# Patient Record
Sex: Female | Born: 1975 | Race: White | Hispanic: No | Marital: Married | Smoking: Never smoker
Health system: Southern US, Community
[De-identification: ages and names within clinical notes are randomized; demographics above are authoritative.]

## PROBLEM LIST (undated history)

## (undated) DIAGNOSIS — Z98891 History of uterine scar from previous surgery: Secondary | ICD-10-CM

## (undated) DIAGNOSIS — IMO0002 Reserved for concepts with insufficient information to code with codable children: Secondary | ICD-10-CM

## (undated) DIAGNOSIS — D249 Benign neoplasm of unspecified breast: Secondary | ICD-10-CM

## (undated) DIAGNOSIS — Z87440 Personal history of urinary (tract) infections: Secondary | ICD-10-CM

## (undated) DIAGNOSIS — Z8619 Personal history of other infectious and parasitic diseases: Secondary | ICD-10-CM

## (undated) HISTORY — DX: Personal history of other infectious and parasitic diseases: Z86.19

## (undated) HISTORY — PX: BREAST LUMPECTOMY: SHX2

## (undated) HISTORY — DX: Personal history of urinary (tract) infections: Z87.440

## (undated) HISTORY — PX: WISDOM TOOTH EXTRACTION: SHX21

## (undated) HISTORY — DX: Reserved for concepts with insufficient information to code with codable children: IMO0002

## (undated) HISTORY — PX: BIOPSY BREAST: PRO8

---
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
## 2007-11-28 ENCOUNTER — Ambulatory Visit: Payer: Self-pay | Admitting: Family Medicine

## 2010-10-29 NOTE — L&D Delivery Note (Signed)
Cesarean Section Procedure Note  Indications: failed induction, failure to progress: arrest of dilation and LGA baby  Pre-operative Diagnosis: 40 week 3 day pregnancy, likely LGA infant.  Post-operative Diagnosis: same, LGA infant  Surgeon: BOVARD,Kristee Angus   Anesthesia: Epidural anesthesia  Procedure Details  D/W pt and FOB known likely LGA baby and arrest of dilitation at 9+cm, also R/B/A of LTCS.  The risks, benefits, complications, treatment options, and expected outcomes were discussed with the patient.  The patient concurred with the proposed plan, giving informed consent.  . The patient was taken to Operating Room # 1, identified as Margaret George and the procedure verified as C-Section Delivery. A Time Out was held and the above information confirmed.  After dosing of epidural, the patient was draped and prepped in the usual sterile manner. A Pfannenstiel incision was made and carried down through the subcutaneous tissue to the fascia. Fascial incision was made and extended transversely. The fascia was separated from the underlying rectus tissue superiorly and inferiorly. The peritoneum was identified and entered bluntly. Peritoneal incision was extended longitudinally. The utero-vesical peritoneal reflection was incised transversely and the bladder flap was bluntly freed from the lower uterine segment. A low transverse uterine incision was made. Delivered from cephalic presentation was a 10#4ozFemale with Apgar scores of 8 at one minute and 9 at five minutes.After delivery, baby was handed off to waiting pediatric staff.   After the umbilical cord was clamped and cut cord blood was obtained for evaluation. The placenta was removed intact and appeared normal. The uterine outline, tubes and ovaries appeared normal. The uterine incision was closed with running locked sutures of 0.0 Monocryl .  At uterine incision closure a cervical extention was noted and outlined with clamps, closed with 0.0  Monocryl.   Hemostasis was observed after additional sutures and bovy cautery. Lavage was carried out until clear. The peritoneum was reapproximated with 2-0 Vicryl.  The fascia was then reapproximated with running sutures of 0 Vicryl. The subcuticular adipose tissue was irrigated and made hemostatic with bovy cautery.  Reapproximated with 3-0 plain gut.  The skin was reapproximated with 3.0  Vicryl.  Benzoin and steri-strips were used to aid in the closure.    Instrument, sponge, and needle counts were correct prior the abdominal closure and at the conclusion of the case.  Pt tolerated procedure well.   Findings: Viable female infant ([place]) weighing 10#4oz, nl uterus, tubes and ovaried  Estimated Blood Loss:  1000cc         Total IV Fluids:   Uop: 200cc, red tinged at beginning of procedure, clearing at end         Specimens: Placenta to L&D          Complications:  Cervical extention, o/w procedure tolerated well         Disposition: PACU - hemodynamically stable.         Condition: stable  Attending Attestation: I performed the procedure.

## 2011-03-08 LAB — ANTIBODY SCREEN: Antibody Screen: NEGATIVE

## 2011-03-08 LAB — HIV ANTIBODY (ROUTINE TESTING W REFLEX): HIV: NONREACTIVE

## 2011-03-09 LAB — RUBELLA ANTIBODY, IGM: Rubella: IMMUNE

## 2011-04-06 DIAGNOSIS — O368190 Decreased fetal movements, unspecified trimester, not applicable or unspecified: Secondary | ICD-10-CM

## 2011-05-25 ENCOUNTER — Other Ambulatory Visit: Payer: Self-pay | Admitting: Obstetrics and Gynecology

## 2011-05-25 ENCOUNTER — Ambulatory Visit
Admission: RE | Admit: 2011-05-25 | Discharge: 2011-05-25 | Disposition: A | Discharge status: Home / Self Care | Payer: BC Managed Care – PPO | Source: Ambulatory Visit | Attending: Obstetrics and Gynecology | Admitting: Obstetrics and Gynecology

## 2011-05-25 DIAGNOSIS — N63 Unspecified lump in unspecified breast: Secondary | ICD-10-CM

## 2011-05-31 ENCOUNTER — Ambulatory Visit
Admission: RE | Admit: 2011-05-31 | Discharge: 2011-05-31 | Disposition: A | Discharge status: Home / Self Care | Payer: BC Managed Care – PPO | Source: Ambulatory Visit | Attending: Obstetrics and Gynecology | Admitting: Obstetrics and Gynecology

## 2011-05-31 DIAGNOSIS — N63 Unspecified lump in unspecified breast: Secondary | ICD-10-CM

## 2011-07-06 ENCOUNTER — Encounter (HOSPITAL_COMMUNITY): Payer: Self-pay | Admitting: *Deleted

## 2011-07-06 ENCOUNTER — Inpatient Hospital Stay (HOSPITAL_COMMUNITY): Payer: BC Managed Care – PPO

## 2011-07-06 ENCOUNTER — Inpatient Hospital Stay (HOSPITAL_COMMUNITY)
Admission: AD | Admit: 2011-07-06 | Discharge: 2011-07-06 | Disposition: A | Discharge status: Home / Self Care | Payer: BC Managed Care – PPO | Source: Ambulatory Visit | Attending: Obstetrics and Gynecology | Admitting: Obstetrics and Gynecology

## 2011-07-06 DIAGNOSIS — O4420 Partial placenta previa NOS or without hemorrhage, unspecified trimester: Secondary | ICD-10-CM

## 2011-07-06 DIAGNOSIS — Y92410 Unspecified street and highway as the place of occurrence of the external cause: Secondary | ICD-10-CM | POA: Insufficient documentation

## 2011-07-06 DIAGNOSIS — O4400 Placenta previa specified as without hemorrhage, unspecified trimester: Secondary | ICD-10-CM | POA: Insufficient documentation

## 2011-07-06 DIAGNOSIS — Z0379 Encounter for other suspected maternal and fetal conditions ruled out: Secondary | ICD-10-CM | POA: Insufficient documentation

## 2011-07-06 HISTORY — DX: Benign neoplasm of unspecified breast: D24.9
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
## 2011-07-06 NOTE — Consult Note (Signed)
Consulted with Dr. Ambrose Mantle, reviewed HPI/Exam>order ultrasound with 4 hr monitoring.
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
## 2011-07-06 NOTE — Progress Notes (Signed)
Dr. Ambrose Mantle at bedside; reviewed fetal monitoring strip.
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
## 2011-09-18 ENCOUNTER — Other Ambulatory Visit (HOSPITAL_COMMUNITY): Payer: Self-pay | Admitting: Obstetrics and Gynecology

## 2011-09-18 LAB — STREP B DNA PROBE: GBS: POSITIVE

## 2011-09-19 ENCOUNTER — Ambulatory Visit (HOSPITAL_COMMUNITY)
Admission: RE | Admit: 2011-09-19 | Discharge: 2011-09-19 | Disposition: A | Discharge status: Home / Self Care | Payer: BC Managed Care – PPO | Source: Ambulatory Visit | Attending: Obstetrics and Gynecology | Admitting: Obstetrics and Gynecology

## 2011-09-19 DIAGNOSIS — O4400 Placenta previa specified as without hemorrhage, unspecified trimester: Secondary | ICD-10-CM | POA: Insufficient documentation

## 2011-09-19 DIAGNOSIS — O3660X0 Maternal care for excessive fetal growth, unspecified trimester, not applicable or unspecified: Secondary | ICD-10-CM | POA: Insufficient documentation

## 2011-10-11 ENCOUNTER — Inpatient Hospital Stay (HOSPITAL_COMMUNITY)
Admission: RE | Admit: 2011-10-11 | Payer: BC Managed Care – PPO | Source: Ambulatory Visit | Admitting: Obstetrics and Gynecology

## 2011-10-11 ENCOUNTER — Other Ambulatory Visit: Payer: Self-pay | Admitting: Obstetrics and Gynecology

## 2011-10-11 ENCOUNTER — Encounter: Payer: Self-pay | Admitting: Obstetrics and Gynecology

## 2011-10-11 DIAGNOSIS — IMO0002 Reserved for concepts with insufficient information to code with codable children: Secondary | ICD-10-CM | POA: Insufficient documentation

## 2011-10-11 DIAGNOSIS — Z3490 Encounter for supervision of normal pregnancy, unspecified, unspecified trimester: Secondary | ICD-10-CM | POA: Insufficient documentation

## 2011-10-11 HISTORY — DX: Reserved for concepts with insufficient information to code with codable children: IMO0002

## 2011-10-11 NOTE — H&P (Signed)
Margaret George is an 35 y.o. female. G1P0 at 40+ for iol, given post dates and LGA baby by [place] (AFI = 4, EFW = 10# on [place] 12/13).  +FM, no LOF, no VB, occ ctx; uncomplicated prenatal care except LGA and GBBS +  Pertinent OB Gynecological History:  Sexually transmitted diseases: no past history Previous GYN Procedures: none  Last mammogram: normal  Last pap: normal  OB History: G1, P0   Menstrual History:  No LMP recorded. Patient is pregnant.    Past Medical History  Diagnosis Date  . 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 Breast lumpectomy   . Biopsy breast   . Tooth extraction     No family history on file.  Social History:  reports that she has never smoked. She does not have any smokeless tobacco history on file. She reports that she does not drink alcohol or use illicit drugs.  Allergies:  Allergies  Allergen Reactions  . Codeine Itching     (Not in a hospital admission)  Review of Systems  Constitutional: Negative.   HENT: Negative.   Eyes: Negative.   Respiratory: Negative.   Cardiovascular: Negative.   Gastrointestinal: Negative.   Genitourinary: Negative.   Musculoskeletal: Negative.   Skin: Negative.   Neurological: Negative.   Psychiatric/Behavioral: Negative.     There were no vitals taken for this visit. Physical Exam  Constitutional: She is oriented to person, place, and time. She appears well-developed and well-nourished.  HENT:  Head: Normocephalic and atraumatic.  Neck: Normal range of motion. Neck supple.  Cardiovascular: Normal rate and regular rhythm.   Respiratory: Effort normal and breath sounds normal. No respiratory distress.  GI: Soft. Bowel sounds are normal. There is no tenderness.  Musculoskeletal: Normal range of motion.  Neurological: She is alert and oriented to person, place, and time.  Skin: Skin is warm and dry.  Psychiatric: She has a normal mood and affect.  SVE  1/70/-2  No results found for this or any previous visit (from the past 24 hour(s)).  No results found.  Assessment/Plan: 35yo G1P0 at 40+ for IOL given term status and LGA by [place].  D/w pt possibility of LTCS given LGA and iol.  IOL by cervidil then ptiocin and AROM.  Pt GBBS + - PCN prophylaxis.  Close monitoring.    Margaret George,Margaret George 10/11/2011, 6:58 PM

## 2011-10-12 ENCOUNTER — Inpatient Hospital Stay (HOSPITAL_COMMUNITY)
Admission: AD | Admit: 2011-10-12 | Discharge: 2011-10-16 | DRG: 371 | Disposition: A | Discharge status: Home / Self Care | Payer: BC Managed Care – PPO | Source: Ambulatory Visit | Attending: Obstetrics and Gynecology | Admitting: Obstetrics and Gynecology

## 2011-10-12 ENCOUNTER — Encounter (HOSPITAL_COMMUNITY): Payer: Self-pay | Admitting: Anesthesiology

## 2011-10-12 ENCOUNTER — Encounter (HOSPITAL_COMMUNITY): Payer: Self-pay

## 2011-10-12 DIAGNOSIS — Z22330 Carrier of Group B streptococcus: Secondary | ICD-10-CM

## 2011-10-12 DIAGNOSIS — IMO0002 Reserved for concepts with insufficient information to code with codable children: Secondary | ICD-10-CM

## 2011-10-12 DIAGNOSIS — Z98891 History of uterine scar from previous surgery: Secondary | ICD-10-CM

## 2011-10-12 DIAGNOSIS — O99892 Other specified diseases and conditions complicating childbirth: Secondary | ICD-10-CM | POA: Diagnosis present

## 2011-10-12 DIAGNOSIS — O3660X0 Maternal care for excessive fetal growth, unspecified trimester, not applicable or unspecified: Principal | ICD-10-CM | POA: Diagnosis present

## 2011-10-12 LAB — RPR: RPR Ser Ql: NONREACTIVE

## 2011-10-12 LAB — CBC
Hemoglobin: 12.4 g/dL (ref 12.0–15.0)
MCH: 30.6 pg (ref 26.0–34.0)
MCHC: 33.7 g/dL (ref 30.0–36.0)
Platelets: 119 10*3/uL — ABNORMAL LOW (ref 150–400)

## 2011-10-12 MED ORDER — LIDOCAINE HCL (PF) 1 % IJ SOLN: 30.0000 mL | INTRAMUSCULAR | Status: DC | PRN

## 2011-10-12 MED ORDER — BUTORPHANOL TARTRATE 2 MG/ML IJ SOLN: 2.0000 mg | INTRAMUSCULAR | Status: DC | PRN

## 2011-10-12 MED ORDER — OXYTOCIN 20 UNITS IN LACTATED RINGERS INFUSION - SIMPLE
1.0000 m[IU]/min | INTRAVENOUS | Status: DC
  Administered 2011-10-12: 17 m[IU]/min via INTRAVENOUS
  Administered 2011-10-12: 1 m[IU]/min via INTRAVENOUS
  Administered 2011-10-13: 24 m[IU]/min via INTRAVENOUS
  Administered 2011-10-13: 18 m[IU]/min via INTRAVENOUS
  Administered 2011-10-13: 14 m[IU]/min via INTRAVENOUS

## 2011-10-12 MED ORDER — OXYTOCIN BOLUS FROM INFUSION
500.0000 mL | Freq: Once | INTRAVENOUS | Status: DC
  Filled 2011-10-12: qty 500
  Filled 2011-10-12: qty 1000

## 2011-10-12 MED ORDER — ACETAMINOPHEN 325 MG PO TABS: 650.0000 mg | ORAL_TABLET | ORAL | Status: DC | PRN

## 2011-10-12 MED ORDER — ONDANSETRON HCL 4 MG/2ML IJ SOLN
4.0000 mg | Freq: Four times a day (QID) | INTRAMUSCULAR | Status: DC | PRN
  Administered 2011-10-13: 4 mg via INTRAVENOUS
  Filled 2011-10-12: qty 2

## 2011-10-12 MED ORDER — LACTATED RINGERS IV SOLN
INTRAVENOUS | Status: DC
  Administered 2011-10-12 – 2011-10-13 (×5): via INTRAVENOUS

## 2011-10-12 MED ORDER — CITRIC ACID-SODIUM CITRATE 334-500 MG/5ML PO SOLN
30.0000 mL | ORAL | Status: DC | PRN
  Administered 2011-10-13: 30 mL via ORAL
  Filled 2011-10-12: qty 15

## 2011-10-12 MED ORDER — FLEET ENEMA 7-19 GM/118ML RE ENEM: 1.0000 | ENEMA | RECTAL | Status: DC | PRN

## 2011-10-12 MED ORDER — EPHEDRINE 5 MG/ML INJ: 10.0000 mg | INTRAVENOUS | Status: DC | PRN

## 2011-10-12 MED ORDER — OXYTOCIN 20 UNITS IN LACTATED RINGERS INFUSION - SIMPLE
125.0000 mL/h | Freq: Once | INTRAVENOUS | Status: DC
  Filled 2011-10-12: qty 1000

## 2011-10-12 MED ORDER — TERBUTALINE SULFATE 1 MG/ML IJ SOLN: 0.2500 mg | Freq: Once | INTRAMUSCULAR | Status: AC | PRN

## 2011-10-12 MED ORDER — LACTATED RINGERS IV SOLN: 500.0000 mL | Freq: Once | INTRAVENOUS | Status: DC

## 2011-10-12 MED ORDER — FENTANYL 2.5 MCG/ML BUPIVACAINE 1/10 % EPIDURAL INFUSION (WH - ANES)
14.0000 mL/h | INTRAMUSCULAR | Status: DC
  Administered 2011-10-12 – 2011-10-13 (×6): 14 mL/h via EPIDURAL
  Filled 2011-10-12 (×7): qty 60

## 2011-10-12 MED ORDER — PENICILLIN G POTASSIUM 5000000 UNITS IJ SOLR
2.5000 10*6.[IU] | INTRAVENOUS | Status: DC
  Administered 2011-10-12 – 2011-10-13 (×7): 2.5 10*6.[IU] via INTRAVENOUS
  Filled 2011-10-12 (×11): qty 2.5

## 2011-10-12 MED ORDER — FENTANYL 2.5 MCG/ML BUPIVACAINE 1/10 % EPIDURAL INFUSION (WH - ANES)
INTRAMUSCULAR | Status: DC | PRN
Start: 2011-10-12 — End: 2011-10-13
  Administered 2011-10-12: 14 mL/h via EPIDURAL

## 2011-10-12 MED ORDER — OXYCODONE-ACETAMINOPHEN 5-325 MG PO TABS: 2.0000 | ORAL_TABLET | ORAL | Status: DC | PRN

## 2011-10-12 MED ORDER — PHENYLEPHRINE 40 MCG/ML (10ML) SYRINGE FOR IV PUSH (FOR BLOOD PRESSURE SUPPORT)
80.0000 ug | PREFILLED_SYRINGE | INTRAVENOUS | Status: DC | PRN
  Filled 2011-10-12: qty 5

## 2011-10-12 MED ORDER — DIPHENHYDRAMINE HCL 50 MG/ML IJ SOLN
12.5000 mg | INTRAMUSCULAR | Status: AC | PRN
  Administered 2011-10-12 – 2011-10-13 (×3): 12.5 mg via INTRAVENOUS
  Filled 2011-10-12 (×2): qty 1

## 2011-10-12 MED ORDER — PENICILLIN G POTASSIUM 5000000 UNITS IJ SOLR
5.0000 10*6.[IU] | Freq: Once | INTRAMUSCULAR | Status: AC
  Administered 2011-10-12: 5 10*6.[IU] via INTRAVENOUS
  Filled 2011-10-12: qty 5

## 2011-10-12 MED ORDER — PHENYLEPHRINE 40 MCG/ML (10ML) SYRINGE FOR IV PUSH (FOR BLOOD PRESSURE SUPPORT): 80.0000 ug | PREFILLED_SYRINGE | INTRAVENOUS | Status: DC | PRN

## 2011-10-12 MED ORDER — IBUPROFEN 600 MG PO TABS: 600.0000 mg | ORAL_TABLET | Freq: Four times a day (QID) | ORAL | Status: DC | PRN

## 2011-10-12 MED ORDER — LACTATED RINGERS IV SOLN
500.0000 mL | INTRAVENOUS | Status: DC | PRN
  Administered 2011-10-13: 1000 mL via INTRAVENOUS

## 2011-10-12 MED ORDER — DINOPROSTONE 10 MG VA INST: 10.0000 mg | VAGINAL_INSERT | Freq: Once | VAGINAL | Status: DC

## 2011-10-12 MED ORDER — EPHEDRINE 5 MG/ML INJ
10.0000 mg | INTRAVENOUS | Status: DC | PRN
  Filled 2011-10-12: qty 4

## 2011-10-12 MED ORDER — LIDOCAINE HCL 1.5 % IJ SOLN
INTRAMUSCULAR | Status: DC | PRN
  Administered 2011-10-12 (×2): 4 mL via EPIDURAL

## 2011-10-12 NOTE — Progress Notes (Addendum)
Margaret George is a 35 y.o. G1P0 at [redacted]w[redacted]d  admitted for iol  Subjective: comf with epidural  Objective: BP 113/74  Pulse 79  Temp(Src) 98.2 F (36.8 C) (Oral)  Ht 5\' 6"  (1.676 m)  Wt 95.255 kg (210 lb)  BMI 33.89 kg/m2  SpO2 99%      FHT:  FHR: 150 bpm, variability: moderate,  accelerations:  Present,  decelerations:  Absent UC:   regular, every 2-3 minutes SVE:   Dilation: 3 Effacement (%): 90 Station: 0 Exam by:: bovard  Labs: Lab Results  Component Value Date   WBC 12.1* 10/12/2011   HGB 12.4 10/12/2011   HCT 36.8 10/12/2011   MCV 90.9 10/12/2011   PLT 119* 10/12/2011    Assessment / Plan: Induction of labor due to term and LGA,  progressing well on pitocin  Labor: Progressing normally Preeclampsia:  no signs or symptoms of toxicity Fetal Wellbeing:  Category II Pain Control:  Epidural I/D:  n/a Anticipated MOD:  NSVD  BOVARD,Vidal Lampkins 10/12/2011, 5:46 PM

## 2011-10-12 NOTE — Progress Notes (Signed)
Margaret George is a 35 y.o. G1P0 at [redacted]w[redacted]d admitted for induction of labor due to Elective at term and LGA baby.  Subjective: comf with epidural  Objective: BP 104/68  Pulse 78  Temp(Src) 98.4 F (36.9 C) (Oral)  Resp 20  Ht 5\' 6"  (1.676 m)  Wt 95.255 kg (210 lb)  BMI 33.89 kg/m2  SpO2 99%   Total I/O In: -  Out: 900 [Urine:900]  FHT:  FHR: 150 bpm, variability: moderate,  accelerations:  Present,  decelerations:  Absent, scalp stim with SVE UC:   regular, every 3-5 minutes SVE:   Dilation: 3.4 Effacement (%): 90 Station: 0 Exam by:: bovard IUPC placed w/o diff/comp  Labs: Lab Results  Component Value Date   WBC 12.1* 10/12/2011   HGB 12.4 10/12/2011   HCT 36.8 10/12/2011   MCV 90.9 10/12/2011   PLT 119* 10/12/2011    Assessment / Plan: Induction of labor due to term and LGA baby,  progressing slowly  Labor: Progressing normally Preeclampsia:  no signs or symptoms of toxicity Fetal Wellbeing:  Category II Pain Control:  Epidural I/D:  n/a Anticipated MOD:  NSVD  BOVARD,Sequoya Hogsett 10/12/2011, 9:48 PM
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
## 2011-10-12 NOTE — Anesthesia Procedure Notes (Signed)
Epidural Patient location during procedure: OB Start time: 10/12/2011 2:58 PM  Staffing Anesthesiologist: Jorita Bohanon A. Performed by: anesthesiologist   Preanesthetic Checklist Completed: patient identified, site marked, surgical consent, pre-op evaluation, timeout performed, IV checked, risks and benefits discussed and monitors and equipment checked  Epidural Patient position: sitting Prep: site prepped and draped and DuraPrep Patient monitoring: continuous pulse ox and blood pressure Approach: midline Injection technique: LOR air  Needle:  Needle type: Tuohy  Needle gauge: 17 G Needle length: 9 cm Needle insertion depth: 6 cm Catheter type: closed end flexible Catheter size: 19 Gauge Catheter at skin depth: 11 cm Test dose: negative and 1.5% lidocaine  Assessment Events: blood not aspirated, injection not painful, no injection resistance, negative IV test and no paresthesia  Additional Notes Patient is more comfortable after epidural dosed. Please see RN's note for documentation of vital signs and FHR which are stable.

## 2011-10-12 NOTE — Anesthesia Preprocedure Evaluation (Addendum)
Anesthesia Evaluation  Patient identified by MRN, date of birth, ID band Patient awake    Reviewed: Allergy & Precautions, H&P , Patient's Chart, lab work & pertinent test results  Airway Mallampati: III TM Distance: >3 FB Neck ROM: full    Dental No notable dental hx. (+) Teeth Intact   Pulmonary neg pulmonary ROS,  clear to auscultation  Pulmonary exam normal       Cardiovascular neg cardio ROS regular Normal    Neuro/Psych Negative Neurological ROS  Negative Psych ROS   GI/Hepatic negative GI ROS, Neg liver ROS,   Endo/Other  Negative Endocrine ROS  Renal/GU negative Renal ROS  Genitourinary negative   Musculoskeletal   Abdominal Normal abdominal exam  (+)   Peds  Hematology negative hematology ROS (+)   Anesthesia Other Findings   Reproductive/Obstetrics (+) Pregnancy                           Anesthesia Physical Anesthesia Plan  ASA: II  Anesthesia Plan: Epidural   Post-op Pain Management:    Induction:   Airway Management Planned:   Additional Equipment:   Intra-op Plan:   Post-operative Plan:   Informed Consent: I have reviewed the patients History and Physical, chart, labs and discussed the procedure including the risks, benefits and alternatives for the proposed anesthesia with the patient or authorized representative who has indicated his/her understanding and acceptance.     Plan Discussed with: Anesthesiologist and Surgeon  Anesthesia Plan Comments:         Anesthesia Quick Evaluation  

## 2011-10-12 NOTE — H&P (Signed)
Gayatri Teasdale is a 35 y.o. female presenting for iol given term and LGA baby.  Uncomplicated PNC except LGA baby, 10# on [place]. Maternal Medical History:  Fetal activity: Perceived fetal activity is normal.      OB History    Grav Para Term Preterm Abortions TAB SAB Ect Mult Living   1              Past Medical History  Diagnosis Date  . 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 Normal pregnancy 10/11/2011  . Large for gestational age fetus 10/11/2011   Past Surgical History  Procedure Date  . Breast lumpectomy   . Biopsy breast   . Tooth extraction    Family History: family history is not on file. Social History:  reports that she has never smoked. She does not have any smokeless tobacco history on file. She reports that she does not drink alcohol or use illicit drugs. All Codeine Meds PNV  Review of Systems  Constitutional: Negative.   HENT: Negative.   Eyes: Negative.   Respiratory: Negative.   Cardiovascular: Negative.   Gastrointestinal: Negative.   Genitourinary: Negative.   Musculoskeletal: Negative.   Skin: Negative.   Neurological: Negative.   Psychiatric/Behavioral: Negative.     Dilation: 1 Effacement (%): 50 Station: -2 Exam by:: j.[email or web address],rn Blood pressure 109/71, pulse 81, temperature 98.4 F (36.9 C), temperature source Oral, height 5\' 6"  (1.676 m), weight 95.255 kg (210 lb). Maternal Exam:  Uterine Assessment: Contraction strength is moderate.  Contraction frequency is irregular.   Abdomen: Fundal height is 42.   Estimated fetal weight is 10# by [place].   Fetal presentation: vertex     Physical Exam  Constitutional: She is oriented to person, place, and time. She appears well-developed and well-nourished.  HENT:  Head: Normocephalic and atraumatic.  Neck: Normal range of motion. Neck supple. No thyromegaly present.  Cardiovascular: Normal rate and regular rhythm.   Respiratory: Effort normal and breath sounds normal. No respiratory distress.  GI:  Soft. Bowel sounds are normal. There is no tenderness.  Musculoskeletal: Normal range of motion.  Neurological: She is alert and oriented to person, place, and time.  Psychiatric: She has a normal mood and affect.    Prenatal labs: ABO, Rh: O/Positive/-- (05/10 0000) Antibody: Negative (05/10 0000) Rubella: Immune (05/11 0000) RPR: Nonreactive (05/10 0000)  HBsAg: Negative (05/10 0000)  HIV: Non-reactive (05/10 0000)  GBS: Positive (11/20 0000) Hgb 11.9/Plt 183K/ Hgb electro WNL/CF neg/ First  Tri WNL/AFP WNL/  [place] cwd Nl anat, EDC 10/09/11, ant plac, female  Assessment/Plan: 35yo G1P0 at 15+ for iol given term status and LGA fetus Epidural for pain Pitocin to augment PCN for gbbs prophylaxis   BOVARD,Sailor Haughn 10/12/2011, 9:45 AM

## 2011-10-13 ENCOUNTER — Encounter (HOSPITAL_COMMUNITY): Payer: Self-pay

## 2011-10-13 ENCOUNTER — Encounter (HOSPITAL_COMMUNITY)
Admission: AD | Disposition: A | Discharge status: Home / Self Care | Payer: Self-pay | Source: Ambulatory Visit | Attending: Obstetrics and Gynecology

## 2011-10-13 ENCOUNTER — Inpatient Hospital Stay (HOSPITAL_COMMUNITY): Payer: BC Managed Care – PPO | Admitting: Anesthesiology

## 2011-10-13 ENCOUNTER — Encounter (HOSPITAL_COMMUNITY): Payer: Self-pay | Admitting: Anesthesiology

## 2011-10-13 DIAGNOSIS — Z98891 History of uterine scar from previous surgery: Secondary | ICD-10-CM

## 2011-10-13 LAB — CBC
HCT: 31.6 % — ABNORMAL LOW (ref 36.0–46.0)
Hemoglobin: 10.6 g/dL — ABNORMAL LOW (ref 12.0–15.0)
MCV: 91.6 fL (ref 78.0–100.0)
Platelets: 114 10*3/uL — ABNORMAL LOW (ref 150–400)
RBC: 3.45 MIL/uL — ABNORMAL LOW (ref 3.87–5.11)
WBC: 23.1 10*3/uL — ABNORMAL HIGH (ref 4.0–10.5)

## 2011-10-13 SURGERY — Surgical Case
Anesthesia: Regional | Site: Abdomen | Wound class: Clean Contaminated

## 2011-10-13 MED ORDER — FENTANYL CITRATE 0.05 MG/ML IJ SOLN
INTRAMUSCULAR | Status: AC
  Administered 2011-10-13: 25 ug via INTRAVENOUS
  Filled 2011-10-13: qty 2

## 2011-10-13 MED ORDER — ONDANSETRON HCL 4 MG/2ML IJ SOLN
INTRAMUSCULAR | Status: AC
  Filled 2011-10-13: qty 2

## 2011-10-13 MED ORDER — FENTANYL CITRATE 0.05 MG/ML IJ SOLN
INTRAMUSCULAR | Status: AC
  Filled 2011-10-13: qty 2

## 2011-10-13 MED ORDER — IBUPROFEN 600 MG PO TABS: 600.0000 mg | ORAL_TABLET | Freq: Four times a day (QID) | ORAL | Status: DC | PRN

## 2011-10-13 MED ORDER — NALOXONE HCL 0.4 MG/ML IJ SOLN: 0.4000 mg | INTRAMUSCULAR | Status: DC | PRN

## 2011-10-13 MED ORDER — SCOPOLAMINE 1 MG/3DAYS TD PT72
MEDICATED_PATCH | TRANSDERMAL | Status: AC
  Administered 2011-10-13: 1.5 mg via TRANSDERMAL
  Filled 2011-10-13: qty 1

## 2011-10-13 MED ORDER — LIDOCAINE-EPINEPHRINE (PF) 2 %-1:200000 IJ SOLN
INTRAMUSCULAR | Status: AC
  Filled 2011-10-13: qty 20

## 2011-10-13 MED ORDER — HYDROMORPHONE BOLUS VIA INFUSION
INTRAVENOUS | Status: DC | PRN
  Administered 2011-10-13: 1 mg via INTRAVENOUS

## 2011-10-13 MED ORDER — OXYTOCIN 20 UNITS IN LACTATED RINGERS INFUSION - SIMPLE: 125.0000 mL/h | INTRAVENOUS | Status: AC

## 2011-10-13 MED ORDER — DIPHENHYDRAMINE HCL 50 MG/ML IJ SOLN: 12.5000 mg | INTRAMUSCULAR | Status: DC | PRN

## 2011-10-13 MED ORDER — MORPHINE SULFATE (PF) 0.5 MG/ML IJ SOLN
INTRAMUSCULAR | Status: DC | PRN
  Administered 2011-10-13: 1 mg via INTRAVENOUS

## 2011-10-13 MED ORDER — OXYTOCIN 10 UNIT/ML IJ SOLN
INTRAMUSCULAR | Status: AC
  Filled 2011-10-13: qty 2

## 2011-10-13 MED ORDER — SODIUM CHLORIDE 0.9 % IJ SOLN: 3.0000 mL | INTRAMUSCULAR | Status: DC | PRN

## 2011-10-13 MED ORDER — MEPERIDINE HCL 25 MG/ML IJ SOLN: 6.2500 mg | INTRAMUSCULAR | Status: DC | PRN

## 2011-10-13 MED ORDER — SODIUM BICARBONATE 8.4 % IV SOLN
INTRAVENOUS | Status: DC | PRN
  Administered 2011-10-13: 10 mL via EPIDURAL

## 2011-10-13 MED ORDER — SODIUM CHLORIDE 0.9 % IV SOLN: 1.0000 ug/kg/h | INTRAVENOUS | Status: DC | PRN

## 2011-10-13 MED ORDER — WITCH HAZEL-GLYCERIN EX PADS: 1.0000 "application " | MEDICATED_PAD | CUTANEOUS | Status: DC | PRN

## 2011-10-13 MED ORDER — LACTATED RINGERS IV SOLN
INTRAVENOUS | Status: DC
  Administered 2011-10-13 – 2011-10-14 (×2): via INTRAVENOUS

## 2011-10-13 MED ORDER — MEPERIDINE HCL 25 MG/ML IJ SOLN
INTRAMUSCULAR | Status: AC
  Filled 2011-10-13: qty 1

## 2011-10-13 MED ORDER — CEFAZOLIN SODIUM 1-5 GM-% IV SOLN
INTRAVENOUS | Status: DC | PRN
  Administered 2011-10-13: 2 g via INTRAVENOUS

## 2011-10-13 MED ORDER — OXYTOCIN 10 UNIT/ML IJ SOLN
20.0000 [IU] | INTRAVENOUS | Status: DC | PRN
  Administered 2011-10-13: 20 [IU] via INTRAVENOUS

## 2011-10-13 MED ORDER — OXYTOCIN 10 UNIT/ML IJ SOLN
INTRAMUSCULAR | Status: AC
  Filled 2011-10-13: qty 4

## 2011-10-13 MED ORDER — PHENYLEPHRINE HCL 10 MG/ML IJ SOLN
INTRAMUSCULAR | Status: DC | PRN
  Administered 2011-10-13: 80 ug via INTRAVENOUS
  Administered 2011-10-13 (×3): 120 ug via INTRAVENOUS
  Administered 2011-10-13 (×2): 80 ug via INTRAVENOUS

## 2011-10-13 MED ORDER — OXYCODONE-ACETAMINOPHEN 5-325 MG PO TABS
1.0000 | ORAL_TABLET | ORAL | Status: DC | PRN
  Administered 2011-10-14: 1 via ORAL
  Administered 2011-10-14 (×2): 2 via ORAL
  Administered 2011-10-14 (×2): 1 via ORAL
  Administered 2011-10-15 (×2): 2 via ORAL
  Administered 2011-10-15 – 2011-10-16 (×4): 1 via ORAL
  Filled 2011-10-13 (×5): qty 1
  Filled 2011-10-13: qty 2
  Filled 2011-10-13: qty 1
  Filled 2011-10-13: qty 2
  Filled 2011-10-13: qty 1
  Filled 2011-10-13: qty 2
  Filled 2011-10-13: qty 1

## 2011-10-13 MED ORDER — MENTHOL 3 MG MT LOZG: 1.0000 | LOZENGE | OROMUCOSAL | Status: DC | PRN

## 2011-10-13 MED ORDER — PHENYLEPHRINE 40 MCG/ML (10ML) SYRINGE FOR IV PUSH (FOR BLOOD PRESSURE SUPPORT)
PREFILLED_SYRINGE | INTRAVENOUS | Status: AC
  Filled 2011-10-13: qty 5

## 2011-10-13 MED ORDER — ONDANSETRON HCL 4 MG PO TABS: 4.0000 mg | ORAL_TABLET | ORAL | Status: DC | PRN

## 2011-10-13 MED ORDER — SCOPOLAMINE 1 MG/3DAYS TD PT72
1.0000 | MEDICATED_PATCH | Freq: Once | TRANSDERMAL | Status: DC
  Administered 2011-10-13: 1.5 mg via TRANSDERMAL

## 2011-10-13 MED ORDER — DIPHENHYDRAMINE HCL 25 MG PO CAPS: 25.0000 mg | ORAL_CAPSULE | Freq: Four times a day (QID) | ORAL | Status: DC | PRN

## 2011-10-13 MED ORDER — PRENATAL PLUS 27-1 MG PO TABS: 1.0000 | ORAL_TABLET | Freq: Every day | ORAL | Status: DC

## 2011-10-13 MED ORDER — LANOLIN HYDROUS EX OINT: 1.0000 "application " | TOPICAL_OINTMENT | CUTANEOUS | Status: DC | PRN

## 2011-10-13 MED ORDER — TETANUS-DIPHTH-ACELL PERTUSSIS 5-2.5-18.5 LF-MCG/0.5 IM SUSP: 0.5000 mL | Freq: Once | INTRAMUSCULAR | Status: DC

## 2011-10-13 MED ORDER — PHENYLEPHRINE 40 MCG/ML (10ML) SYRINGE FOR IV PUSH (FOR BLOOD PRESSURE SUPPORT)
PREFILLED_SYRINGE | INTRAVENOUS | Status: AC
  Filled 2011-10-13: qty 10

## 2011-10-13 MED ORDER — DIBUCAINE 1 % RE OINT: 1.0000 "application " | TOPICAL_OINTMENT | RECTAL | Status: DC | PRN

## 2011-10-13 MED ORDER — SIMETHICONE 80 MG PO CHEW
80.0000 mg | CHEWABLE_TABLET | Freq: Three times a day (TID) | ORAL | Status: DC
  Administered 2011-10-14 – 2011-10-16 (×6): 80 mg via ORAL

## 2011-10-13 MED ORDER — MEPERIDINE HCL 25 MG/ML IJ SOLN
INTRAMUSCULAR | Status: DC | PRN
  Administered 2011-10-13 (×2): 25 mg via INTRAVENOUS

## 2011-10-13 MED ORDER — ZOLPIDEM TARTRATE 5 MG PO TABS: 5.0000 mg | ORAL_TABLET | Freq: Every evening | ORAL | Status: DC | PRN

## 2011-10-13 MED ORDER — EPHEDRINE SULFATE 50 MG/ML IJ SOLN
INTRAMUSCULAR | Status: DC | PRN
  Administered 2011-10-13 (×5): 10 mg via INTRAVENOUS

## 2011-10-13 MED ORDER — MORPHINE SULFATE 0.5 MG/ML IJ SOLN
INTRAMUSCULAR | Status: AC
  Filled 2011-10-13: qty 10

## 2011-10-13 MED ORDER — EPHEDRINE 5 MG/ML INJ
INTRAVENOUS | Status: AC
  Filled 2011-10-13: qty 10

## 2011-10-13 MED ORDER — DIPHENHYDRAMINE HCL 25 MG PO CAPS
25.0000 mg | ORAL_CAPSULE | ORAL | Status: DC | PRN
  Administered 2011-10-14: 25 mg via ORAL
  Filled 2011-10-13: qty 1

## 2011-10-13 MED ORDER — SODIUM CHLORIDE 0.9 % IR SOLN
Status: DC | PRN
  Administered 2011-10-13: 1000 mL

## 2011-10-13 MED ORDER — HYDROMORPHONE HCL PF 1 MG/ML IJ SOLN
INTRAMUSCULAR | Status: AC
  Filled 2011-10-13: qty 1

## 2011-10-13 MED ORDER — KETOROLAC TROMETHAMINE 30 MG/ML IJ SOLN: 15.0000 mg | Freq: Once | INTRAMUSCULAR | Status: DC | PRN

## 2011-10-13 MED ORDER — SENNOSIDES-DOCUSATE SODIUM 8.6-50 MG PO TABS
2.0000 | ORAL_TABLET | Freq: Every day | ORAL | Status: DC
  Administered 2011-10-14 – 2011-10-15 (×2): 2 via ORAL

## 2011-10-13 MED ORDER — LACTATED RINGERS IV SOLN
INTRAVENOUS | Status: DC | PRN
  Administered 2011-10-13: 16:00:00 via INTRAVENOUS

## 2011-10-13 MED ORDER — SIMETHICONE 80 MG PO CHEW: 80.0000 mg | CHEWABLE_TABLET | ORAL | Status: DC | PRN

## 2011-10-13 MED ORDER — MORPHINE SULFATE (PF) 0.5 MG/ML IJ SOLN
INTRAMUSCULAR | Status: DC | PRN
  Administered 2011-10-13: 4 mg via EPIDURAL

## 2011-10-13 MED ORDER — IBUPROFEN 800 MG PO TABS
800.0000 mg | ORAL_TABLET | Freq: Three times a day (TID) | ORAL | Status: DC
  Administered 2011-10-14 – 2011-10-16 (×7): 800 mg via ORAL
  Filled 2011-10-13 (×7): qty 1

## 2011-10-13 MED ORDER — SODIUM BICARBONATE 8.4 % IV SOLN
INTRAVENOUS | Status: AC
  Filled 2011-10-13: qty 50

## 2011-10-13 MED ORDER — LACTATED RINGERS IV SOLN
INTRAVENOUS | Status: DC | PRN
  Administered 2011-10-13 (×3): via INTRAVENOUS

## 2011-10-13 MED ORDER — ONDANSETRON HCL 4 MG/2ML IJ SOLN: 4.0000 mg | Freq: Three times a day (TID) | INTRAMUSCULAR | Status: DC | PRN

## 2011-10-13 MED ORDER — PROMETHAZINE HCL 25 MG/ML IJ SOLN: 6.2500 mg | INTRAMUSCULAR | Status: DC | PRN

## 2011-10-13 MED ORDER — ONDANSETRON HCL 4 MG/2ML IJ SOLN: 4.0000 mg | INTRAMUSCULAR | Status: DC | PRN

## 2011-10-13 MED ORDER — CEFAZOLIN SODIUM 1-5 GM-% IV SOLN
INTRAVENOUS | Status: AC
  Filled 2011-10-13: qty 100

## 2011-10-13 MED ORDER — DIPHENHYDRAMINE HCL 50 MG/ML IJ SOLN: 25.0000 mg | INTRAMUSCULAR | Status: DC | PRN

## 2011-10-13 MED ORDER — FENTANYL CITRATE 0.05 MG/ML IJ SOLN
25.0000 ug | INTRAMUSCULAR | Status: DC | PRN
  Administered 2011-10-13: 25 ug via INTRAVENOUS
  Administered 2011-10-13: 50 ug via INTRAVENOUS
  Administered 2011-10-13: 25 ug via INTRAVENOUS

## 2011-10-13 MED ORDER — FENTANYL CITRATE 0.05 MG/ML IJ SOLN
INTRAMUSCULAR | Status: DC | PRN
  Administered 2011-10-13: 100 ug via INTRAVENOUS

## 2011-10-13 MED ORDER — ONDANSETRON HCL 4 MG/2ML IJ SOLN
INTRAMUSCULAR | Status: DC | PRN
  Administered 2011-10-13: 4 mg via INTRAVENOUS

## 2011-10-13 MED ORDER — PRENATAL PLUS 27-1 MG PO TABS
1.0000 | ORAL_TABLET | Freq: Every day | ORAL | Status: DC
  Administered 2011-10-14 – 2011-10-16 (×3): 1 via ORAL
  Filled 2011-10-13 (×3): qty 1

## 2011-10-13 SURGICAL SUPPLY — 36 items
BENZOIN TINCTURE PRP APPL 2/3 (GAUZE/BANDAGES/DRESSINGS) ×2 IMPLANT
CHLORAPREP W/TINT 26ML (MISCELLANEOUS) ×2 IMPLANT
CLOSURE STERI STRIP 1/2 X4 (GAUZE/BANDAGES/DRESSINGS) ×2 IMPLANT
CLOTH BEACON ORANGE TIMEOUT ST (SAFETY) ×2 IMPLANT
CONTAINER PREFILL 10% NBF 15ML (MISCELLANEOUS) IMPLANT
DRESSING TELFA 8X3 (GAUZE/BANDAGES/DRESSINGS) ×2 IMPLANT
DRSG COVADERM 4X10 (GAUZE/BANDAGES/DRESSINGS) ×2 IMPLANT
DRSG PAD ABDOMINAL 8X10 ST (GAUZE/BANDAGES/DRESSINGS) ×2 IMPLANT
ELECT REM PT RETURN 9FT ADLT (ELECTROSURGICAL) ×2
ELECTRODE REM PT RTRN 9FT ADLT (ELECTROSURGICAL) ×1 IMPLANT
EXTRACTOR VACUUM M CUP 4 TUBE (SUCTIONS) IMPLANT
GLOVE BIO SURGEON STRL SZ 6.5 (GLOVE) ×4 IMPLANT
GLOVE BIO SURGEON STRL SZ7 (GLOVE) ×2 IMPLANT
GOWN PREVENTION PLUS LG XLONG (DISPOSABLE) ×4 IMPLANT
KIT ABG SYR 3ML LUER SLIP (SYRINGE) IMPLANT
NEEDLE HYPO 25X5/8 SAFETYGLIDE (NEEDLE) IMPLANT
NS IRRIG 1000ML POUR BTL (IV SOLUTION) ×2 IMPLANT
PACK C SECTION WH (CUSTOM PROCEDURE TRAY) ×2 IMPLANT
RTRCTR C-SECT PINK 25CM LRG (MISCELLANEOUS) ×2 IMPLANT
SLEEVE SCD COMPRESS KNEE MED (MISCELLANEOUS) ×2 IMPLANT
STAPLER VISISTAT 35W (STAPLE) IMPLANT
SUT MNCRL 0 VIOLET CTX 36 (SUTURE) ×4 IMPLANT
SUT MONOCRYL 0 CTX 36 (SUTURE) ×4
SUT PLAIN 1 NONE 54 (SUTURE) IMPLANT
SUT PLAIN 2 0 XLH (SUTURE) ×2 IMPLANT
SUT VIC AB 0 CT1 27 (SUTURE) ×2
SUT VIC AB 0 CT1 27XBRD ANBCTR (SUTURE) ×2 IMPLANT
SUT VIC AB 2-0 CT1 27 (SUTURE) ×1
SUT VIC AB 2-0 CT1 TAPERPNT 27 (SUTURE) ×1 IMPLANT
SUT VIC AB 3-0 PS2 18 (SUTURE) ×1
SUT VIC AB 3-0 PS2 18XBRD (SUTURE) ×1 IMPLANT
SYR BULB IRRIGATION 50ML (SYRINGE) IMPLANT
TAPE CLOTH SURG 4X10 WHT LF (GAUZE/BANDAGES/DRESSINGS) ×2 IMPLANT
TOWEL OR 17X24 6PK STRL BLUE (TOWEL DISPOSABLE) ×4 IMPLANT
TRAY FOLEY CATH 14FR (SET/KITS/TRAYS/PACK) IMPLANT
WATER STERILE IRR 1000ML POUR (IV SOLUTION) ×2 IMPLANT

## 2011-10-13 NOTE — Progress Notes (Signed)
Margaret George is a 35 y.o. G1P0 at [redacted]w[redacted]d admitted for induction of labor due to Elective at term and LGA.  Subjective: comf with epidural  Objective: BP 103/55  Pulse 73  Temp(Src) 98.3 F (36.8 C) (Axillary)  Resp 18  Ht 5\' 6"  (1.676 m)  Wt 95.255 kg (210 lb)  BMI 33.89 kg/m2  SpO2 96% I/O last 3 completed shifts: In: -  Out: 3100 [Urine:3100]    FHT:  FHR: 150 bpm, variability: moderate,  accelerations:  Present,  decelerations:  Absent, scalp stimulation with SVE UC:   regular, every 2-3 minutes SVE:   Dilation: Lip/rim Effacement (%): 90;100 Station: +1 Exam by:: Dr. Ellyn Hack  Labs: Lab Results  Component Value Date   WBC 12.1* 10/12/2011   HGB 12.4 10/12/2011   HCT 36.8 10/12/2011   MCV 90.9 10/12/2011   PLT 119* 10/12/2011    Assessment / Plan: Induction of labor due to term and LGA,  With arrest of dilitation.  D/w pt R/B/A of LTCS including but not limited to bleeding, infection, damage to surrounding organs.  Pt and FOB voice understanding, wish to proceed.   Labor: arrest of dilitation Preeclampsia:  no signs or symptoms of toxicity Fetal Wellbeing:  Category II Pain Control:  Epidural I/D:  n/a Anticipated MOD:  LTCS  BOVARD,Ayo Smoak 10/13/2011, 1:37 PM

## 2011-10-13 NOTE — Anesthesia Postprocedure Evaluation (Signed)
  Anesthesia Post-op Note  Patient: Margaret George  Procedure(s) Performed:  CESAREAN SECTION - Primary cesarean section with delivery of baby girl at 1530. Apgars 8/9.  Patient Location: PACU and Mother/Baby  Anesthesia Type: Epidural  Level of Consciousness: awake, alert  and oriented  Airway and Oxygen Therapy: Patient Spontanous Breathing   Post-op Assessment: Patient's Cardiovascular Status Stable and Respiratory Function Stable  Post-op Vital Signs: stable  Complications: No apparent anesthesia complications

## 2011-10-13 NOTE — OR Nursing (Signed)
Uterus massaged by S. Delonta Yohannes RN Two tubes of cord blood to lab. Foley catheter in place upon arrival to OR. Urine color-bloody.

## 2011-10-13 NOTE — Progress Notes (Signed)
Coralee North, CRNA at pt bedside dosing epidural for primary c-section

## 2011-10-13 NOTE — Progress Notes (Signed)
Kaitrin Seybold is a 35 y.o. G1P0 at [redacted]w[redacted]d admitted for induction of labor due to Elective at term and LGA.  Subjective: Uncomf, hip and back pain.  Objective: BP 115/69  Pulse 75  Temp(Src) 98.3 F (36.8 C) (Axillary)  Resp 16  Ht 5\' 6"  (1.676 m)  Wt 95.255 kg (210 lb)  BMI 33.89 kg/m2  SpO2 99% I/O last 3 completed shifts: In: -  Out: 3100 [Urine:3100]    FHT:  FHR: 160's bpm, variability: moderate,  accelerations:  Present,  decelerations:  Absent UC:   regular, every 2 minutes SVE:   Dilation: Lip/rim Effacement (%): 100;90 Station: +1 Exam by:: Dr. Ellyn Hack  Labs: Lab Results  Component Value Date   WBC 12.1* 10/12/2011   HGB 12.4 10/12/2011   HCT 36.8 10/12/2011   MCV 90.9 10/12/2011   PLT 119* 10/12/2011    Assessment / Plan: Induction of labor due to term and LGA baby,  progressing slowly, if no change in 1 hr have d/w pt poss of LTCS  Labor: Progressing slowly Preeclampsia:  no signs or symptoms of toxicity Fetal Wellbeing:  Category II Pain Control:  Epidural, will give bolus dose I/D:  n/a Anticipated MOD:  NSVD vs LTCS  BOVARD,Layann Bluett 10/13/2011, 11:40 AM

## 2011-10-13 NOTE — Transfer of Care (Signed)
Immediate Anesthesia Transfer of Care Note  Patient: Margaret George  Procedure(s) Performed:  CESAREAN SECTION - Primary cesarean section with delivery of baby girl at 1530. Apgars 8/9.  Patient Location: PACU  Anesthesia Type: Epidural  Level of Consciousness: awake, alert  and oriented  Airway & Oxygen Therapy: Patient Spontanous Breathing  Post-op Assessment: Report given to PACU RN and Post -op Vital signs reviewed and stable  Post vital signs: stable  Complications: No apparent anesthesia complications

## 2011-10-13 NOTE — Addendum Note (Signed)
Addendum  created 10/13/11 1942 by Len Blalock   Modules edited:Notes Section

## 2011-10-13 NOTE — Progress Notes (Signed)
Dr. Ellyn Hack updated on pt status via telephone. To come reassess patient

## 2011-10-13 NOTE — Progress Notes (Signed)
Margaret George is a 35 y.o. G1P0 at [redacted]w[redacted]d  admitted for induction of labor due to term and LGA.  Subjective: comf with epidural  Objective: BP 89/52  Pulse 79  Temp(Src) 98.3 F (36.8 C) (Axillary)  Resp 16  Ht 5\' 6"  (1.676 m)  Wt 95.255 kg (210 lb)  BMI 33.89 kg/m2  SpO2 99% I/O last 3 completed shifts: In: -  Out: 3100 [Urine:3100]    FHT:  FHR: 140's bpm, variability: moderate,  accelerations:  Present,  decelerations:  Absent UC:   regular, every 2-3 minutes SVE:   Dilation: Lip/rim Effacement (%): 100;90 Station: +1 Exam by:: Dr. Ellyn Hack  Labs: Lab Results  Component Value Date   WBC 12.1* 10/12/2011   HGB 12.4 10/12/2011   HCT 36.8 10/12/2011   MCV 90.9 10/12/2011   PLT 119* 10/12/2011    Assessment / Plan: Induction of labor due to term and LGA,  progressing well on pitocin  Labor: Progressing slowly, recheck in 2-3 hrs, hopefully start pushing Preeclampsia:  no signs or symptoms of toxicity Fetal Wellbeing:  Category II Pain Control:  Epidural I/D:  n/a Anticipated MOD:  NSVD  BOVARD,Samira Acero 10/13/2011, 9:35 AM

## 2011-10-13 NOTE — Anesthesia Postprocedure Evaluation (Signed)
Anesthesia Post Note  Patient: Margaret George  Procedure(s) Performed:  CESAREAN SECTION - Primary cesarean section with delivery of baby girl at 1530. Apgars 8/9.  Anesthesia type: Epidural  Patient location: PACU  Post pain: Pain level controlled  Post assessment: Post-op Vital signs reviewed  Last Vitals:  Filed Vitals:   10/13/11 1745  BP: 125/70  Pulse: 126  Temp:   Resp: 20    Post vital signs: Reviewed  Level of consciousness: awake  Complications: No apparent anesthesia complications

## 2011-10-13 NOTE — Progress Notes (Addendum)
IUPC MVU averaging 90-100, however, contraction palpating strong Q 1.5-3. MD aware and states IUPC may not be working properly

## 2011-10-13 NOTE — Progress Notes (Signed)
Pitocin off, and IUPC discontinued per Dr. Ellyn Hack

## 2011-10-13 NOTE — Progress Notes (Signed)
Margaret George is a 35 y.o. G1P0 at [redacted]w[redacted]d admitted for induction of labor due to Elective at term and LGA.  Subjective: Comfortable with epidural  Objective: BP 90/50  Pulse 76  Temp(Src) 98.6 F (37 C) (Oral)  Resp 20  Ht 5\' 6"  (1.676 m)  Wt 95.255 kg (210 lb)  BMI 33.89 kg/m2  SpO2 99%   Total I/O In: -  Out: 1650 [Urine:1650]  FHT:  FHR: 140-150 bpm, variability: moderate,  accelerations:  Present,  decelerations:  Absent, scalp stimulation with SVE UC:   regular, every 2-4 minutes SVE:   Dilation:6 Effacement (%): 90 Station: 0/+1 Exam by:: Dr.Bovard  Labs: Lab Results  Component Value Date   WBC 12.1* 10/12/2011   HGB 12.4 10/12/2011   HCT 36.8 10/12/2011   MCV 90.9 10/12/2011   PLT 119* 10/12/2011    Assessment / Plan: Induction of labor due to term and LGA,  progressing well on pitocin  Labor: Progressing normally, will monitor closely to make sure stays on curve Preeclampsia:  no signs or symptoms of toxicity Fetal Wellbeing:  Category II Pain Control:  Epidural I/D:  n/a Anticipated MOD:  NSVD  BOVARD,Charlestine Rookstool 10/13/2011, 3:47 AM

## 2011-10-14 LAB — CBC
HCT: 26.7 % — ABNORMAL LOW (ref 36.0–46.0)
Hemoglobin: 9.1 g/dL — ABNORMAL LOW (ref 12.0–15.0)
MCHC: 34.1 g/dL (ref 30.0–36.0)
MCV: 91.4 fL (ref 78.0–100.0)

## 2011-10-14 NOTE — Progress Notes (Signed)
Subjective: Postpartum Day 1: Cesarean Delivery Patient reports incisional pain and tolerating PO.    Objective: Vital signs in last 24 hours: Temp:  [98.3 F (36.8 C)-99.4 F (37.4 C)] 98.3 F (36.8 C) (12/16 0423) Pulse Rate:  [73-139] 89  (12/16 0423) Resp:  [16-20] 18  (12/16 0423) BP: (90-125)/(51-81) 92/60 mmHg (12/16 0425) SpO2:  [94 %-100 %] 97 % (12/16 0423)  Physical Exam:  General: alert and no distress Lochia: appropriate Uterine Fundus: firm Incision: healing well, bandage intact DVT Evaluation: No evidence of DVT seen on physical exam.   Basename 10/14/11 0507 10/13/11 1709  HGB 9.1* 10.6*  HCT 26.7* 31.6*    Assessment/Plan: Status post Cesarean section. Doing well postoperatively.  Continue current care.  BOVARD,Anielle Headrick 10/14/2011, 9:54 AM

## 2011-10-15 ENCOUNTER — Encounter (HOSPITAL_COMMUNITY): Payer: Self-pay | Admitting: *Deleted

## 2011-10-15 NOTE — Progress Notes (Signed)
Subjective: Postpartum Day 2: Cesarean Delivery Patient reports incisional pain, tolerating PO, + flatus and no problems voiding.    Objective: Vital signs in last 24 hours: Temp:  [97.7 F (36.5 C)-98.2 F (36.8 C)] 97.7 F (36.5 C) (12/17 0516) Pulse Rate:  [79-96] 85  (12/17 0516) Resp:  [18] 18  (12/17 0516) BP: (85-101)/(53-67) 95/62 mmHg (12/17 0516) SpO2:  [96 %-97 %] 96 % (12/16 1600)  Physical Exam:  General: alert and no distress Lochia: appropriate Uterine Fundus: firm Incision: healing well DVT Evaluation: No evidence of DVT seen on physical exam.   Basename 10/14/11 0507 10/13/11 1709  HGB 9.1* 10.6*  HCT 26.7* 31.6*    Assessment/Plan: Status post Cesarean section. Doing well postoperatively.  Continue current care.  BOVARD,Ashia Dehner 10/15/2011, 8:43 AM

## 2011-10-16 ENCOUNTER — Encounter (HOSPITAL_COMMUNITY): Payer: Self-pay | Admitting: Obstetrics and Gynecology

## 2011-10-16 MED ORDER — IBUPROFEN 800 MG PO TABS: 800.0000 mg | ORAL_TABLET | Freq: Three times a day (TID) | ORAL | Status: AC | PRN

## 2011-10-16 MED ORDER — LACTATED RINGERS IV SOLN: INTRAVENOUS | Status: DC

## 2011-10-16 MED ORDER — CEFAZOLIN SODIUM-DEXTROSE 2-3 GM-% IV SOLR
2.0000 g | INTRAVENOUS | Status: DC
  Filled 2011-10-16: qty 50

## 2011-10-16 MED ORDER — PRENATAL PLUS 27-1 MG PO TABS: 1.0000 | ORAL_TABLET | Freq: Every day | ORAL | Status: DC

## 2011-10-16 MED ORDER — OXYCODONE-ACETAMINOPHEN 5-325 MG PO TABS: 1.0000 | ORAL_TABLET | ORAL | Status: AC | PRN

## 2011-10-16 NOTE — Progress Notes (Signed)
Subjective: Postpartum Day 3: Cesarean Delivery Patient reports incisional pain, tolerating PO, + flatus and no problems voiding.    Objective: Vital signs in last 24 hours: Temp:  [97.4 F (36.3 C)-98.3 F (36.8 C)] 97.4 F (36.3 C) (12/18 0605) Pulse Rate:  [80-97] 80  (12/18 0605) Resp:  [18-19] 19  (12/18 0605) BP: (101-108)/(64-72) 101/64 mmHg (12/18 7829)  Physical Exam:  General: alert and no distress Lochia: appropriate Uterine Fundus: firm Incision: healing well DVT Evaluation: No evidence of DVT seen on physical exam.   Basename 10/14/11 0507 10/13/11 1709  HGB 9.1* 10.6*  HCT 26.7* 31.6*    Assessment/Plan: Status post Cesarean section. Doing well postoperatively.  Discharge home with standard precautions and return to clinic in 2 weeks for incisional check.  D/C with Motrin/Vicodin/PNC  George,Margaret Berringer 10/16/2011, 8:50 AM

## 2011-10-16 NOTE — Discharge Summary (Signed)
Obstetric Discharge Summary Reason for Admission: induction of labor Prenatal Procedures: none Intrapartum Procedures: cesarean: low cervical, transverse Postpartum Procedures: none Complications-Operative and Postpartum: none Hemoglobin  Date Value Range Status  10/14/2011 9.1* 12.0-15.0 (g/dL) Final     HCT  Date Value Range Status  10/14/2011 26.7* 36.0-46.0 (%) Final    Discharge Diagnoses: Term Pregnancy-delivered and Failed induction  Discharge Information: Date: 10/16/2011 Activity: pelvic rest Diet: routine Medications: PNV, Ibuprofen and Percocet Condition: stable Instructions: refer to practice specific booklet Discharge to: home Follow-up Information    Follow up with BOVARD,Jane Birkel, MD. Make an appointment in 2 weeks.   Contact information:   [address] [facility] [address] [place] [address] [phone]          Newborn Data: Live born female  Birth Weight: 10 lb 4.9 oz (4675 g) APGAR: 8, 9  Home with mother.  BOVARD,Milda Lindvall 10/16/2011, 8:55 AM

## 2013-06-02 ENCOUNTER — Inpatient Hospital Stay (HOSPITAL_COMMUNITY): Admission: AD | Admit: 2013-06-02 | Payer: Self-pay | Source: Ambulatory Visit | Admitting: Obstetrics and Gynecology

## 2013-06-02 LAB — OB RESULTS CONSOLE RUBELLA ANTIBODY, IGM: RUBELLA: IMMUNE

## 2013-06-02 LAB — OB RESULTS CONSOLE RPR: RPR: NONREACTIVE

## 2013-06-02 LAB — OB RESULTS CONSOLE ABO/RH: RH TYPE: POSITIVE

## 2013-06-02 LAB — OB RESULTS CONSOLE HIV ANTIBODY (ROUTINE TESTING): HIV: NONREACTIVE

## 2013-12-16 ENCOUNTER — Encounter (HOSPITAL_COMMUNITY): Payer: Self-pay | Admitting: Pharmacist

## 2013-12-29 ENCOUNTER — Encounter (HOSPITAL_COMMUNITY): Payer: Self-pay

## 2013-12-29 NOTE — H&P (Signed)
Margaret George is a 38 y.o. female  G2P1001 at 29wk. Relatively uncomplicated Prenatal care.  +FM, no LOF, no VB, occ ctx; Previous pregnancy macrosomic - 10#4. Pregnancy complicated by seasonal allergies.  D/W pt r/b/a of LTCS. Maternal Medical History:  Contractions: Frequency: irregular.    Fetal activity: Perceived fetal activity is normal.    Prenatal Complications - Diabetes: none.    OB History   Grav Para Term Preterm Abortions TAB SAB Ect Mult Living   2 1 1       1     G1 LTCS female 10#4 G2  Present No abn pap, No STDSs Past Medical History  Diagnosis Date  . 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 Normal pregnancy 10/11/2011  . Large for gestational age fetus 10/11/2011  . Large for gestational age (LGA) 10/12/2011   Past Surgical History  Procedure Laterality Date  . Breast lumpectomy    . Biopsy breast    . Tooth extraction    . Cesarean section  10/13/2011    Procedure: CESAREAN SECTION;  Surgeon: Thornell Sartorius, MD;  Location: [facility];  Service: Gynecology;  Laterality: N/A;  Primary cesarean section with delivery of baby girl at 68. Apgars 8/9.   Family History: DM, HTN, Diverticulitis, Kidney Dz, RA, MI Social History:  reports that she has never smoked. She does not have any smokeless tobacco history on file. She reports that she does not drink alcohol or use illicit drugs.married, Pharmacist, hospital  Meds PNV, TUMS All Codeine   Prenatal Transfer Tool  Maternal Diabetes: No Genetic Screening: Normal Maternal Ultrasounds/Referrals: Normal Fetal Ultrasounds or other Referrals:  None Maternal Substance Abuse:  No Significant Maternal Medications:  None Significant Maternal Lab Results:  Lab values include: Group B Strep negative Other Comments:  None  Review of Systems  Constitutional: Negative.   HENT: Negative.   Eyes: Negative.   Respiratory: Negative.   Cardiovascular: Negative.   Gastrointestinal: Negative.   Genitourinary: Negative.    Musculoskeletal: Negative.   Skin: Negative.   Neurological: Negative.   Psychiatric/Behavioral: Negative.       unknown if currently breastfeeding. Maternal Exam:  Uterine Assessment: Contraction frequency is irregular.   Abdomen: Surgical scars: low transverse.   Fundal height is appropriate for gestation.   Estimated fetal weight is 10-11#.   Fetal presentation: vertex  Introitus: Normal vulva. Normal vagina.  Cervix: Cervix evaluated by digital exam.     Physical Exam  Constitutional: She is oriented to person, place, and time. She appears well-developed and well-nourished.  HENT:  Head: Normocephalic and atraumatic.  Cardiovascular: Normal rate and regular rhythm.   Respiratory: Effort normal and breath sounds normal. No respiratory distress. She has no wheezes.  GI: Soft. Bowel sounds are normal. She exhibits no distension. There is no tenderness.  Musculoskeletal: Normal range of motion.  Neurological: She is alert and oriented to person, place, and time.  Skin: Skin is warm and dry.  Psychiatric: She has a normal mood and affect. Her behavior is normal.    Prenatal labs: ABO, Rh: O/Positive/-- (08/05 1049) Antibody:  neg Rubella: Immune (08/05 1049) RPR: Nonreactive (08/05 1049)  HBsAg:   neg HIV: Non-reactive (08/05 1049)  GBS:   neg  Hgb 12.6/Ur Cx neg/nl Hgb electro/CF neg/ First Tri and AFP WNL/glucola 117/Plt 158K  Tdap 10/08/13 Flu 08/07/13  [place] nl anat, post plac, female [place] 70% growth  Assessment/Plan: 37yo G2P1001 at 40+ for LTCS D/w pt r/b/a - will  proceed gbbs neg    BOVARD,Alylah Blakney 12/29/2013, 9:35 PM

## 2013-12-30 ENCOUNTER — Encounter (HOSPITAL_COMMUNITY): Payer: Self-pay

## 2013-12-30 ENCOUNTER — Encounter (HOSPITAL_COMMUNITY)
Admission: RE | Admit: 2013-12-30 | Discharge: 2013-12-30 | Disposition: A | Discharge status: Home / Self Care | Payer: BC Managed Care – PPO | Source: Ambulatory Visit | Attending: Obstetrics and Gynecology | Admitting: Obstetrics and Gynecology

## 2013-12-30 LAB — CBC
HCT: 35.9 % — ABNORMAL LOW (ref 36.0–46.0)
HEMOGLOBIN: 12.2 g/dL (ref 12.0–15.0)
MCH: 29.6 pg (ref 26.0–34.0)
MCHC: 34 g/dL (ref 30.0–36.0)
MCV: 87.1 fL (ref 78.0–100.0)
PLATELETS: 143 10*3/uL — AB (ref 150–400)
RBC: 4.12 MIL/uL (ref 3.87–5.11)
RDW: 13.8 % (ref 11.5–15.5)
WBC: 10.8 10*3/uL — ABNORMAL HIGH (ref 4.0–10.5)

## 2013-12-30 LAB — TYPE AND SCREEN
ABO/RH(D): O POS
Antibody Screen: NEGATIVE

## 2013-12-30 LAB — RPR: RPR: NONREACTIVE

## 2013-12-30 NOTE — Patient Instructions (Signed)
22 Margaret George  12/30/2013   Your procedure is scheduled on:  12/31/13  Enter through the Main Entrance of [facility] at [place] up the phone at the desk and dial [phone].   Call this number if you have problems the morning of surgery: [phone]   Remember:   Do not eat food:After Midnight.  Do not drink clear liquids: 4 Hours before arrival.  Take these medicines the morning of surgery with A SIP OF WATER: NA   Do not wear jewelry, make-up or nail polish.  Do not wear lotions, powders, or perfumes. You may wear deodorant.  Do not shave 24 hours prior to surgery.  Do not bring valuables to the hospital.  [facility] is not   responsible for any belongings or valuables brought to the hospital.  Contacts, dentures or bridgework may not be worn into surgery.  Leave suitcase in the car. After surgery it may be brought to your room.  For patients admitted to the hospital, checkout time is 11:00 AM the day of              discharge.   Patients discharged the day of surgery will not be allowed to drive             home.  Name and phone number of your driver: NA  Special Instructions:      Please read over the following fact sheets that you were given:   Surgical Site Infection Prevention

## 2013-12-31 ENCOUNTER — Encounter (HOSPITAL_COMMUNITY): Payer: BC Managed Care – PPO | Admitting: Anesthesiology

## 2013-12-31 ENCOUNTER — Inpatient Hospital Stay (HOSPITAL_COMMUNITY): Payer: BC Managed Care – PPO | Admitting: Anesthesiology

## 2013-12-31 ENCOUNTER — Inpatient Hospital Stay (HOSPITAL_COMMUNITY)
Admission: RE | Admit: 2013-12-31 | Discharge: 2014-01-03 | DRG: 766 | Disposition: A | Discharge status: Home / Self Care | Payer: BC Managed Care – PPO | Source: Ambulatory Visit | Attending: Obstetrics and Gynecology | Admitting: Obstetrics and Gynecology

## 2013-12-31 ENCOUNTER — Encounter (HOSPITAL_COMMUNITY): Payer: Self-pay | Admitting: *Deleted

## 2013-12-31 ENCOUNTER — Encounter (HOSPITAL_COMMUNITY)
Admission: RE | Disposition: A | Discharge status: Home / Self Care | Payer: Self-pay | Source: Ambulatory Visit | Attending: Obstetrics and Gynecology

## 2013-12-31 DIAGNOSIS — Z98891 History of uterine scar from previous surgery: Secondary | ICD-10-CM

## 2013-12-31 DIAGNOSIS — O09529 Supervision of elderly multigravida, unspecified trimester: Secondary | ICD-10-CM | POA: Diagnosis present

## 2013-12-31 DIAGNOSIS — O34219 Maternal care for unspecified type scar from previous cesarean delivery: Principal | ICD-10-CM | POA: Diagnosis present

## 2013-12-31 HISTORY — DX: History of uterine scar from previous surgery: Z98.891

## 2013-12-31 SURGERY — Surgical Case
Anesthesia: Spinal | Site: Abdomen

## 2013-12-31 MED ORDER — KETOROLAC TROMETHAMINE 60 MG/2ML IM SOLN
INTRAMUSCULAR | Status: AC
  Administered 2013-12-31: 60 mg via INTRAMUSCULAR
  Filled 2013-12-31: qty 2

## 2013-12-31 MED ORDER — LACTATED RINGERS IV SOLN
INTRAVENOUS | Status: DC
  Administered 2013-12-31 (×2): via INTRAVENOUS

## 2013-12-31 MED ORDER — MORPHINE SULFATE 0.5 MG/ML IJ SOLN
INTRAMUSCULAR | Status: AC
  Filled 2013-12-31: qty 10

## 2013-12-31 MED ORDER — CEFAZOLIN SODIUM-DEXTROSE 2-3 GM-% IV SOLR
INTRAVENOUS | Status: AC
  Filled 2013-12-31: qty 50

## 2013-12-31 MED ORDER — PHENYLEPHRINE 8 MG IN D5W 100 ML (0.08MG/ML) PREMIX OPTIME
INJECTION | INTRAVENOUS | Status: DC | PRN
  Administered 2013-12-31: 60 ug/min via INTRAVENOUS

## 2013-12-31 MED ORDER — FENTANYL CITRATE 0.05 MG/ML IJ SOLN: 25.0000 ug | INTRAMUSCULAR | Status: DC | PRN

## 2013-12-31 MED ORDER — LACTATED RINGERS IV SOLN
Freq: Once | INTRAVENOUS | Status: AC
  Administered 2013-12-31: 10:00:00 via INTRAVENOUS

## 2013-12-31 MED ORDER — SCOPOLAMINE 1 MG/3DAYS TD PT72
1.0000 | MEDICATED_PATCH | Freq: Once | TRANSDERMAL | Status: DC
  Administered 2013-12-31: 1.5 mg via TRANSDERMAL

## 2013-12-31 MED ORDER — DIBUCAINE 1 % RE OINT: 1.0000 "application " | TOPICAL_OINTMENT | RECTAL | Status: DC | PRN

## 2013-12-31 MED ORDER — PRENATAL MULTIVITAMIN CH
1.0000 | ORAL_TABLET | Freq: Every day | ORAL | Status: DC
  Administered 2014-01-01 – 2014-01-02 (×2): 1 via ORAL
  Filled 2013-12-31 (×2): qty 1

## 2013-12-31 MED ORDER — MEPERIDINE HCL 25 MG/ML IJ SOLN: 6.2500 mg | INTRAMUSCULAR | Status: DC | PRN

## 2013-12-31 MED ORDER — DIPHENHYDRAMINE HCL 50 MG/ML IJ SOLN
12.5000 mg | INTRAMUSCULAR | Status: DC | PRN
Start: 2013-12-31 — End: 2014-01-03

## 2013-12-31 MED ORDER — SIMETHICONE 80 MG PO CHEW
80.0000 mg | CHEWABLE_TABLET | ORAL | Status: DC
  Administered 2014-01-01 – 2014-01-03 (×3): 80 mg via ORAL
  Filled 2013-12-31 (×2): qty 1

## 2013-12-31 MED ORDER — SIMETHICONE 80 MG PO CHEW
80.0000 mg | CHEWABLE_TABLET | Freq: Three times a day (TID) | ORAL | Status: DC
  Administered 2013-12-31 – 2014-01-03 (×7): 80 mg via ORAL
  Filled 2013-12-31 (×8): qty 1

## 2013-12-31 MED ORDER — ONDANSETRON HCL 4 MG/2ML IJ SOLN: 4.0000 mg | Freq: Three times a day (TID) | INTRAMUSCULAR | Status: DC | PRN

## 2013-12-31 MED ORDER — ONDANSETRON HCL 4 MG PO TABS: 4.0000 mg | ORAL_TABLET | ORAL | Status: DC | PRN

## 2013-12-31 MED ORDER — CEFAZOLIN SODIUM-DEXTROSE 2-3 GM-% IV SOLR
2.0000 g | INTRAVENOUS | Status: AC
  Administered 2013-12-31: 2 g via INTRAVENOUS

## 2013-12-31 MED ORDER — NALOXONE HCL 1 MG/ML IJ SOLN
1.0000 ug/kg/h | INTRAVENOUS | Status: DC | PRN
  Filled 2013-12-31: qty 2

## 2013-12-31 MED ORDER — WITCH HAZEL-GLYCERIN EX PADS: 1.0000 "application " | MEDICATED_PAD | CUTANEOUS | Status: DC | PRN

## 2013-12-31 MED ORDER — METOCLOPRAMIDE HCL 5 MG/ML IJ SOLN: 10.0000 mg | Freq: Once | INTRAMUSCULAR | Status: DC | PRN

## 2013-12-31 MED ORDER — BUPIVACAINE IN DEXTROSE 0.75-8.25 % IT SOLN
INTRATHECAL | Status: DC | PRN
  Administered 2013-12-31: 1.5 mL via INTRATHECAL

## 2013-12-31 MED ORDER — NALBUPHINE HCL 10 MG/ML IJ SOLN
5.0000 mg | INTRAMUSCULAR | Status: DC | PRN
  Administered 2013-12-31: 10 mg via SUBCUTANEOUS

## 2013-12-31 MED ORDER — ONDANSETRON HCL 4 MG/2ML IJ SOLN: 4.0000 mg | INTRAMUSCULAR | Status: DC | PRN

## 2013-12-31 MED ORDER — KETOROLAC TROMETHAMINE 30 MG/ML IJ SOLN: 30.0000 mg | Freq: Four times a day (QID) | INTRAMUSCULAR | Status: AC | PRN

## 2013-12-31 MED ORDER — ONDANSETRON HCL 4 MG/2ML IJ SOLN
INTRAMUSCULAR | Status: DC | PRN
  Administered 2013-12-31: 4 mg via INTRAVENOUS

## 2013-12-31 MED ORDER — SIMETHICONE 80 MG PO CHEW: 80.0000 mg | CHEWABLE_TABLET | ORAL | Status: DC | PRN

## 2013-12-31 MED ORDER — LACTATED RINGERS IV SOLN
INTRAVENOUS | Status: DC
  Administered 2013-12-31 – 2014-01-01 (×2): via INTRAVENOUS

## 2013-12-31 MED ORDER — ONDANSETRON HCL 4 MG/2ML IJ SOLN
INTRAMUSCULAR | Status: AC
  Filled 2013-12-31: qty 2

## 2013-12-31 MED ORDER — LANOLIN HYDROUS EX OINT: 1.0000 "application " | TOPICAL_OINTMENT | CUTANEOUS | Status: DC | PRN

## 2013-12-31 MED ORDER — NALBUPHINE HCL 10 MG/ML IJ SOLN
5.0000 mg | INTRAMUSCULAR | Status: DC | PRN
  Administered 2013-12-31 – 2014-01-01 (×2): 10 mg via INTRAVENOUS
  Filled 2013-12-31 (×2): qty 1

## 2013-12-31 MED ORDER — SCOPOLAMINE 1 MG/3DAYS TD PT72: 1.0000 | MEDICATED_PATCH | Freq: Once | TRANSDERMAL | Status: DC

## 2013-12-31 MED ORDER — SODIUM CHLORIDE 0.9 % IJ SOLN: 3.0000 mL | INTRAMUSCULAR | Status: DC | PRN

## 2013-12-31 MED ORDER — NALBUPHINE HCL 10 MG/ML IJ SOLN
INTRAMUSCULAR | Status: AC
  Administered 2014-01-01: 10 mg via INTRAVENOUS
  Filled 2013-12-31: qty 1

## 2013-12-31 MED ORDER — OXYTOCIN 40 UNITS IN LACTATED RINGERS INFUSION - SIMPLE MED: 62.5000 mL/h | INTRAVENOUS | Status: AC

## 2013-12-31 MED ORDER — MORPHINE SULFATE (PF) 0.5 MG/ML IJ SOLN
INTRAMUSCULAR | Status: DC | PRN
  Administered 2013-12-31: .1 mg via INTRATHECAL

## 2013-12-31 MED ORDER — DIPHENHYDRAMINE HCL 25 MG PO CAPS
25.0000 mg | ORAL_CAPSULE | ORAL | Status: DC | PRN
  Administered 2013-12-31: 25 mg via ORAL
  Filled 2013-12-31 (×2): qty 1

## 2013-12-31 MED ORDER — OXYTOCIN 10 UNIT/ML IJ SOLN
INTRAMUSCULAR | Status: AC
  Filled 2013-12-31: qty 4

## 2013-12-31 MED ORDER — IBUPROFEN 800 MG PO TABS
800.0000 mg | ORAL_TABLET | Freq: Three times a day (TID) | ORAL | Status: DC
  Administered 2013-12-31 – 2014-01-03 (×8): 800 mg via ORAL
  Filled 2013-12-31 (×8): qty 1

## 2013-12-31 MED ORDER — DIPHENHYDRAMINE HCL 25 MG PO CAPS: 25.0000 mg | ORAL_CAPSULE | Freq: Four times a day (QID) | ORAL | Status: DC | PRN

## 2013-12-31 MED ORDER — NALOXONE HCL 0.4 MG/ML IJ SOLN: 0.4000 mg | INTRAMUSCULAR | Status: DC | PRN

## 2013-12-31 MED ORDER — DIPHENHYDRAMINE HCL 50 MG/ML IJ SOLN: 25.0000 mg | INTRAMUSCULAR | Status: DC | PRN

## 2013-12-31 MED ORDER — CALCIUM CARBONATE ANTACID 500 MG PO CHEW: 1.0000 | CHEWABLE_TABLET | Freq: Three times a day (TID) | ORAL | Status: DC | PRN

## 2013-12-31 MED ORDER — MENTHOL 3 MG MT LOZG: 1.0000 | LOZENGE | OROMUCOSAL | Status: DC | PRN

## 2013-12-31 MED ORDER — FENTANYL CITRATE 0.05 MG/ML IJ SOLN
INTRAMUSCULAR | Status: AC
  Filled 2013-12-31: qty 2

## 2013-12-31 MED ORDER — PHENYLEPHRINE 8 MG IN D5W 100 ML (0.08MG/ML) PREMIX OPTIME
INJECTION | INTRAVENOUS | Status: AC
  Filled 2013-12-31: qty 100

## 2013-12-31 MED ORDER — OXYTOCIN 10 UNIT/ML IJ SOLN
40.0000 [IU] | INTRAVENOUS | Status: DC | PRN
  Administered 2013-12-31: 40 [IU] via INTRAVENOUS

## 2013-12-31 MED ORDER — OXYCODONE-ACETAMINOPHEN 5-325 MG PO TABS
1.0000 | ORAL_TABLET | ORAL | Status: DC | PRN
  Administered 2014-01-01 – 2014-01-03 (×10): 1 via ORAL
  Filled 2013-12-31 (×10): qty 1

## 2013-12-31 MED ORDER — FENTANYL CITRATE 0.05 MG/ML IJ SOLN
INTRAMUSCULAR | Status: DC | PRN
  Administered 2013-12-31: 15 ug via INTRATHECAL

## 2013-12-31 MED ORDER — SENNOSIDES-DOCUSATE SODIUM 8.6-50 MG PO TABS
2.0000 | ORAL_TABLET | ORAL | Status: DC
  Administered 2014-01-01 – 2014-01-03 (×3): 2 via ORAL
  Filled 2013-12-31 (×3): qty 2

## 2013-12-31 MED ORDER — KETOROLAC TROMETHAMINE 60 MG/2ML IM SOLN
60.0000 mg | Freq: Once | INTRAMUSCULAR | Status: AC | PRN
  Administered 2013-12-31: 60 mg via INTRAMUSCULAR

## 2013-12-31 MED ORDER — ZOLPIDEM TARTRATE 5 MG PO TABS: 5.0000 mg | ORAL_TABLET | Freq: Every evening | ORAL | Status: DC | PRN

## 2013-12-31 MED ORDER — SCOPOLAMINE 1 MG/3DAYS TD PT72
MEDICATED_PATCH | TRANSDERMAL | Status: AC
  Administered 2013-12-31: 1.5 mg via TRANSDERMAL
  Filled 2013-12-31: qty 1

## 2013-12-31 MED ORDER — METOCLOPRAMIDE HCL 5 MG/ML IJ SOLN: 10.0000 mg | Freq: Three times a day (TID) | INTRAMUSCULAR | Status: DC | PRN

## 2013-12-31 MED ORDER — LACTATED RINGERS IV SOLN: INTRAVENOUS | Status: DC

## 2013-12-31 SURGICAL SUPPLY — 41 items
BENZOIN TINCTURE PRP APPL 2/3 (GAUZE/BANDAGES/DRESSINGS) ×3 IMPLANT
CLAMP CORD UMBIL (MISCELLANEOUS) ×3 IMPLANT
CLOSURE WOUND 1/2 X4 (GAUZE/BANDAGES/DRESSINGS) ×1
CLOTH BEACON ORANGE TIMEOUT ST (SAFETY) ×3 IMPLANT
CONTAINER PREFILL 10% NBF 15ML (MISCELLANEOUS) IMPLANT
DRAPE LG THREE QUARTER DISP (DRAPES) IMPLANT
DRSG OPSITE POSTOP 4X10 (GAUZE/BANDAGES/DRESSINGS) ×3 IMPLANT
DRSG TELFA 3X8 NADH (GAUZE/BANDAGES/DRESSINGS) ×3 IMPLANT
DURAPREP 26ML APPLICATOR (WOUND CARE) ×3 IMPLANT
ELECT REM PT RETURN 9FT ADLT (ELECTROSURGICAL) ×3
ELECTRODE REM PT RTRN 9FT ADLT (ELECTROSURGICAL) ×1 IMPLANT
EXTRACTOR VACUUM M CUP 4 TUBE (SUCTIONS) IMPLANT
EXTRACTOR VACUUM M CUP 4' TUBE (SUCTIONS)
GLOVE BIO SURGEON STRL SZ 6.5 (GLOVE) ×2 IMPLANT
GLOVE BIO SURGEONS STRL SZ 6.5 (GLOVE) ×1
GOWN STRL REUS W/ TWL XL LVL3 (GOWN DISPOSABLE) ×1 IMPLANT
GOWN STRL REUS W/TWL LRG LVL3 (GOWN DISPOSABLE) ×3 IMPLANT
GOWN STRL REUS W/TWL XL LVL3 (GOWN DISPOSABLE) ×2
KIT ABG SYR 3ML LUER SLIP (SYRINGE) IMPLANT
NEEDLE HYPO 25X5/8 SAFETYGLIDE (NEEDLE) IMPLANT
NS IRRIG 1000ML POUR BTL (IV SOLUTION) ×3 IMPLANT
PACK C SECTION WH (CUSTOM PROCEDURE TRAY) ×3 IMPLANT
PAD OB MATERNITY 4.3X12.25 (PERSONAL CARE ITEMS) ×3 IMPLANT
RTRCTR C-SECT PINK 25CM LRG (MISCELLANEOUS) ×3 IMPLANT
STAPLER VISISTAT 35W (STAPLE) IMPLANT
STRIP CLOSURE SKIN 1/2X4 (GAUZE/BANDAGES/DRESSINGS) ×2 IMPLANT
SUT MNCRL 0 VIOLET CTX 36 (SUTURE) ×2 IMPLANT
SUT MONOCRYL 0 CTX 36 (SUTURE) ×4
SUT PLAIN 1 NONE 54 (SUTURE) IMPLANT
SUT PLAIN 2 0 XLH (SUTURE) ×3 IMPLANT
SUT VIC AB 0 CT1 27 (SUTURE) ×4
SUT VIC AB 0 CT1 27XBRD ANBCTR (SUTURE) ×2 IMPLANT
SUT VIC AB 2-0 CT1 27 (SUTURE) ×2
SUT VIC AB 2-0 CT1 TAPERPNT 27 (SUTURE) ×1 IMPLANT
SUT VIC AB 3-0 SH 27 (SUTURE) ×2
SUT VIC AB 3-0 SH 27X BRD (SUTURE) ×1 IMPLANT
SUT VIC AB 4-0 KS 27 (SUTURE) ×3 IMPLANT
SYR BULB IRRIGATION 50ML (SYRINGE) ×3 IMPLANT
TOWEL OR 17X24 6PK STRL BLUE (TOWEL DISPOSABLE) ×3 IMPLANT
TRAY FOLEY CATH 14FR (SET/KITS/TRAYS/PACK) ×3 IMPLANT
WATER STERILE IRR 1000ML POUR (IV SOLUTION) IMPLANT

## 2013-12-31 NOTE — Brief Op Note (Signed)
12/31/2013  12:05 PM  PATIENT:  Margaret George  38 y.o. female  PRE-OPERATIVE DIAGNOSIS:  Repeat Cesearean Section  POST-OPERATIVE DIAGNOSIS:  Repeat Cesearean Section  PROCEDURE:  Procedure(s): REPEAT CESAREAN SECTION (N/A)  SURGEON:  Surgeon(s) and Role:    * Thornell Sartorius, MD - Primary    * Melina Schools, MD - Assisting  FINDINGS: viable female infant at 11:19, apgars 9/9, weight pending, nl uterus, tubes and ovaries.    ANESTHESIA:   spinal  EBL:  Total I/O In: 1600 [I.V.:1600] Out: 900 [Urine:200; Blood:700]  BLOOD ADMINISTERED:none  DRAINS: Urinary Catheter (Foley)   LOCAL MEDICATIONS USED:  NONE  SPECIMEN:  Source of Specimen:  Placenta  DISPOSITION OF SPECIMEN:  L&D  COUNTS:  YES  TOURNIQUET:  * No tourniquets in log *  DICTATION: .Other Dictation: Dictation Number [id number]  PLAN OF CARE: Admit to inpatient   PATIENT DISPOSITION:  PACU - hemodynamically stable.   Delay start of Pharmacological VTE agent (>24hrs) due to surgical blood loss or risk of bleeding: not applicable

## 2013-12-31 NOTE — Anesthesia Postprocedure Evaluation (Signed)
  Anesthesia Post-op Note  Patient: Margaret George  Procedure(s) Performed: Procedure(s): REPEAT CESAREAN SECTION (N/A)  Patient is awake, responsive, moving her legs, and has signs of resolution of her numbness. Pain and nausea are reasonably well controlled. Vital signs are stable and clinically acceptable. Oxygen saturation is clinically acceptable. There are no apparent anesthetic complications at this time. Patient is ready for discharge.

## 2013-12-31 NOTE — Transfer of Care (Signed)
Immediate Anesthesia Transfer of Care Note  Patient: Margaret George  Procedure(s) Performed: Procedure(s): REPEAT CESAREAN SECTION (N/A)  Patient Location: PACU  Anesthesia Type:Spinal  Level of Consciousness: awake, alert  and oriented  Airway & Oxygen Therapy: Patient Spontanous Breathing  Post-op Assessment: Report given to PACU RN and Post -op Vital signs reviewed and stable  Post vital signs: Reviewed and stable  Complications: No apparent anesthesia complications

## 2013-12-31 NOTE — Anesthesia Postprocedure Evaluation (Signed)
  Anesthesia Post-op Note  Patient: Margaret George  Procedure(s) Performed: Procedure(s): REPEAT CESAREAN SECTION (N/A)  Patient Location: Mother/Baby  Anesthesia Type:Spinal  Level of Consciousness: awake  Airway and Oxygen Therapy: Patient Spontanous Breathing  Post-op Pain: mild  Post-op Assessment: Patient's Cardiovascular Status Stable and Respiratory Function Stable  Post-op Vital Signs: stable  Complications: No apparent anesthesia complications

## 2013-12-31 NOTE — Anesthesia Preprocedure Evaluation (Signed)
Anesthesia Evaluation  Patient identified by MRN, date of birth, ID band Patient awake    Reviewed: Allergy & Precautions, H&P , NPO status , Patient's Chart, lab work & pertinent test results, reviewed documented beta blocker date and time   History of Anesthesia Complications Negative for: history of anesthetic complications  Airway Mallampati: II TM Distance: >3 FB Neck ROM: full    Dental  (+) Teeth Intact   Pulmonary neg pulmonary ROS,  breath sounds clear to auscultation        Cardiovascular negative cardio ROS  Rhythm:regular Rate:Normal     Neuro/Psych negative neurological ROS  negative psych ROS   GI/Hepatic negative GI ROS, Neg liver ROS,   Endo/Other  negative endocrine ROS  Renal/GU negative Renal ROS     Musculoskeletal   Abdominal   Peds  Hematology negative hematology ROS (+)   Anesthesia Other Findings   Reproductive/Obstetrics (+) Pregnancy (h/o c/s x1, for repeat C/S)                           Anesthesia Physical Anesthesia Plan  ASA: II  Anesthesia Plan: Spinal   Post-op Pain Management:    Induction:   Airway Management Planned:   Additional Equipment:   Intra-op Plan:   Post-operative Plan:   Informed Consent: I have reviewed the patients History and Physical, chart, labs and discussed the procedure including the risks, benefits and alternatives for the proposed anesthesia with the patient or authorized representative who has indicated his/her understanding and acceptance.     Plan Discussed with: Surgeon and CRNA  Anesthesia Plan Comments:         Anesthesia Quick Evaluation

## 2013-12-31 NOTE — Interval H&P Note (Signed)
History and Physical Interval Note:  12/31/2013 10:33 AM  Margaret George  has presented today for surgery, with the diagnosis of Repeat C/Section  The various methods of treatment have been discussed with the patient and family. After consideration of risks, benefits and other options for treatment, the patient has consented to  Procedure(s): REPEAT CESAREAN SECTION (N/A) as a surgical intervention .  The patient's history has been reviewed, patient examined, no change in status, stable for surgery.  I have reviewed the patient's chart and labs.  Questions were answered to the patient's satisfaction.     BOVARD,Elasia Furnish

## 2013-12-31 NOTE — Consult Note (Signed)
Neonatology Note:   Attendance at C-section:    I was asked by Dr. Melba Coon to attend this repeat C/S at term. The mother is a G2P1 O pos, GBS neg with an uncomplicated pregnancy. ROM at delivery, fluid clear. Infant vigorous with good spontaneous cry and tone. Needed only minimal bulb suctioning. Ap 9/9. Lungs clear to ausc in DR. To CN to care of Pediatrician.   Real Cons, MD

## 2013-12-31 NOTE — Lactation Note (Signed)
This note was copied from the chart of Margaret George. Lactation Consultation Note  Patient Name: Margaret George [id number] Date: 12/31/2013 Reason for consult: Initial assessment of this second-time breastfeeding mom  Who breastfed her 36 yo daughter for 8 months but remembers some early challenges with latching and getting breastfeeding established.  At time of visit, baby asleep in arms of FOB and other family members present.  LC encouraged mom to practice frequent STS and cue feedings and to call for latch assistance from nurse and if needed, LC will assist. LC encouraged review of Baby and Me pp 9, 14 and 20-25 for STS and BF information. LC provided [employer] brochure and reviewed [facility] services and list of community and web site resources.     Maternal Data Formula Feeding for Exclusion: No Infant to breast within first hour of birth: Yes (breastfed for 60 minutes at first attempt after delivery, slightly over 1 hour of age) Has patient been taught Hand Expression?:  (not yet documented) Does the patient have breastfeeding experience prior to this delivery?: Yes  Feeding Feeding Type: Breast Fed  LATCH Score/Interventions Latch: Repeated attempts needed to sustain latch, nipple held in mouth throughout feeding, stimulation needed to elicit sucking reflex. Intervention(s): Adjust position;Assist with latch  Audible Swallowing: A few with stimulation  Type of Nipple: Everted at rest and after stimulation  Comfort (Breast/Nipple): Soft / non-tender     Hold (Positioning): Assistance needed to correctly position infant at breast and maintain latch.  LATCH Score: 7  (most recent feeding per RN assessment)  Lactation Tools Discussed/Used   STS and cue feedings  Consult Status Consult Status: Follow-up Date: 01/01/14 Follow-up type: In-patient    Margaret George [facility] 12/31/2013, 5:52 PM

## 2013-12-31 NOTE — Addendum Note (Signed)
Addendum created 12/31/13 1709 by Ignacia Bayley, CRNA   Modules edited: Notes Section   Notes Section:  File: [id number]

## 2013-12-31 NOTE — Anesthesia Procedure Notes (Signed)
Spinal  Patient location during procedure: OR Start time: 12/31/2013 11:02 AM Staffing Performed by: anesthesiologist  Preanesthetic Checklist Completed: patient identified, site marked, surgical consent, pre-op evaluation, timeout performed, IV checked, risks and benefits discussed and monitors and equipment checked Spinal Block Patient position: sitting Prep: site prepped and draped and DuraPrep Patient monitoring: heart rate, cardiac monitor, continuous pulse ox and blood pressure Approach: midline Location: L3-4 Injection technique: single-shot Needle Needle type: Pencan  Needle gauge: 24 G Needle length: 9 cm Assessment Sensory level: T4 Additional Notes Clear free flow CSF on first attempt.  No paresthesia.  Patient tolerated procedure well with no apparent complications.  Charlton Haws, MD

## 2014-01-01 ENCOUNTER — Encounter (HOSPITAL_COMMUNITY): Payer: Self-pay | Admitting: Obstetrics and Gynecology

## 2014-01-01 LAB — CBC
HCT: 32.1 % — ABNORMAL LOW (ref 36.0–46.0)
Hemoglobin: 10.7 g/dL — ABNORMAL LOW (ref 12.0–15.0)
MCH: 29.3 pg (ref 26.0–34.0)
MCHC: 33.3 g/dL (ref 30.0–36.0)
MCV: 87.9 fL (ref 78.0–100.0)
Platelets: 133 10*3/uL — ABNORMAL LOW (ref 150–400)
RBC: 3.65 MIL/uL — ABNORMAL LOW (ref 3.87–5.11)
RDW: 14 % (ref 11.5–15.5)
WBC: 10.8 10*3/uL — AB (ref 4.0–10.5)

## 2014-01-01 LAB — BIRTH TISSUE RECOVERY COLLECTION (PLACENTA DONATION)

## 2014-01-01 MED ORDER — LIDOCAINE HCL (CARDIAC) 20 MG/ML IV SOLN
INTRAVENOUS | Status: AC
  Filled 2014-01-01: qty 5

## 2014-01-01 MED ORDER — PROPOFOL 10 MG/ML IV EMUL
INTRAVENOUS | Status: AC
  Filled 2014-01-01: qty 20

## 2014-01-01 MED ORDER — ONDANSETRON HCL 4 MG/2ML IJ SOLN
INTRAMUSCULAR | Status: AC
Start: 2014-01-01 — End: 2014-01-01
  Filled 2014-01-01: qty 2

## 2014-01-01 MED ORDER — ROCURONIUM BROMIDE 100 MG/10ML IV SOLN
INTRAVENOUS | Status: AC
  Filled 2014-01-01: qty 1

## 2014-01-01 MED ORDER — FENTANYL CITRATE 0.05 MG/ML IJ SOLN
INTRAMUSCULAR | Status: AC
  Filled 2014-01-01: qty 2

## 2014-01-01 MED ORDER — HYDROMORPHONE HCL PF 1 MG/ML IJ SOLN
INTRAMUSCULAR | Status: AC
  Filled 2014-01-01: qty 1

## 2014-01-01 MED ORDER — DEXAMETHASONE SODIUM PHOSPHATE 10 MG/ML IJ SOLN
INTRAMUSCULAR | Status: AC
  Filled 2014-01-01: qty 1

## 2014-01-01 NOTE — Lactation Note (Signed)
This note was copied from the chart of Margaret Trisha Ken. Lactation Consultation Note Follow up consult:  Mother requested assistance helping latch baby.  Mother put baby to the breast in cradle hold.  Repositioned baby in football hold to get deeper latch, active sucking and swallows observed, lips flanged. Mother nipple flattens when unlatched.  Suggested mother support the breast in a C hold during the feeding to provide a deeper latch.  LS8.  Reviewed cross cradle hold.  Encouraged mother to change positions during feeding.  Encouraged mother to call for further assitance.   Patient Name: Margaret George [id number] Date: 01/01/2014 Reason for consult: Follow-up assessment   Maternal Data Has patient been taught Hand Expression?: Yes  Feeding Feeding Type: Breast Fed Length of feed: 0 min  LATCH Score/Interventions Latch: Grasps breast easily, tongue down, lips flanged, rhythmical sucking. Intervention(s): Breast massage;Assist with latch;Adjust position  Audible Swallowing: Spontaneous and intermittent  Type of Nipple: Everted at rest and after stimulation  Comfort (Breast/Nipple): Filling, red/small blisters or bruises, mild/mod discomfort  Problem noted: Mild/Moderate discomfort  Hold (Positioning): Assistance needed to correctly position infant at breast and maintain latch. Intervention(s): Breastfeeding basics reviewed;Support Pillows;Position options  LATCH Score: 8  Lactation Tools Discussed/Used     Consult Status Consult Status: Follow-up Date: 01/02/14 Follow-up type: In-patient    [name] [facility] 01/01/2014, 8:49 AM

## 2014-01-01 NOTE — Progress Notes (Signed)
Subjective: Postpartum Day 1: Cesarean Delivery Patient reports incisional pain and tolerating PO.  Nl lochia, pain controlled  Objective: Vital signs in last 24 hours: Temp:  [97.3 F (36.3 C)-98.9 F (37.2 C)] 97.7 F (36.5 C) (03/06 0500) Pulse Rate:  [51-100] 63 (03/06 0500) Resp:  [12-20] 18 (03/06 0500) BP: (87-118)/(49-76) 95/56 mmHg (03/06 0500) SpO2:  [94 %-100 %] 94 % (03/06 0500) Weight:  [95.709 kg (211 lb)] 95.709 kg (211 lb) (03/05 1303)  Physical Exam:  General: alert and no distress Lochia: appropriate Uterine Fundus: firm Incision: healing well DVT Evaluation: No evidence of DVT seen on physical exam.   Recent Labs  12/30/13 1425 01/01/14 0550  HGB 12.2 10.7*  HCT 35.9* 32.1*    Assessment/Plan: Status post Cesarean section. Doing well postoperatively.  Continue current care.  BOVARD,Kambria Grima 01/01/2014, 7:37 AM

## 2014-01-01 NOTE — Op Note (Signed)
Margaret George, Margaret George NO.:  [id number]  MEDICAL RECORD NO.:  [id number]  LOCATION:  9106                          FACILITY:  [facility]  PHYSICIAN:  Thornell Sartorius, MD        DATE OF BIRTH:  Sep 27, 1976  DATE OF PROCEDURE:  12/31/2013 DATE OF DISCHARGE:                              OPERATIVE REPORT   PREOPERATIVE DIAGNOSIS:  Intrauterine pregnancy at term, repeat low- transverse cesarean section.  POSTOPERATIVE DIAGNOSIS:  Intrauterine pregnancy at term, repeat low- transverse cesarean section, delivered.  PROCEDURE:  Repeat low-transverse cesarean section.  SURGEON:  Thornell Sartorius, MD  ASSISTANT:  Lucille Passy. Ulanda Edison, M.D.  ANESTHESIA:  Spinal.  FINDINGS:  Viable female infant at 11:19 a.m. with Apgars of 9 at one minute, 9 at five minutes.  Weight pending at the time of dictation. Normal uterus, tubes, and ovaries are noted.  ANESTHESIA:  Spinal.  IV FLUIDS:  1600 mL.  URINE OUTPUT:  200 mL of clear urine at the end of the procedure.  EBL:  700 mL.  COMPLICATIONS:  None.  PATHOLOGY:  Placenta to Labor and Delivery.  PROCEDURE:  After informed consent was reviewed with the patient including the risks, benefits, and alternatives of the surgical procedure, she was transported to the OR, where spinal anesthesia was placed and found to be adequate.  She was then placed in a supine position with a leftward tilt.  Prepped and draped in the normal sterile fashion.  A Foley catheter was sterilely placed in her bladder.  A Pfannenstiel skin incision was made at the level of her previous incision, carried through the underlying layer of fascia sharply.  The fascia was incised in the midline.  The incision was extended laterally with Mayo scissors.  The superior aspect of the fascial incision was grasped with Kocher clamps elevating the rectus muscles were dissected off both bluntly and sharply.  Diastasis was noted with some preperitoneal fat.  At this point,  peritoneum was entered bluntly, and this incision was extended superiorly and inferiorly with good visualization of the bladder.  Alexis skin retractor was placed carefully making sure no bowel was entrapped.  The uterus was visualized, there was bladder peritoneum to uterine peritoneum adhesions, these were lysed and bladder flap was created both digitally and sharply.  Uterus was incised in a transverse fashion.  Baby was delivered from a vertex presentation.  Nose and mouth were suctioned on the field.  Cord was clamped and cut.  Infant was handed off to the awaiting pediatric staff.  The placenta was expressed from the uterus. The uterus was cleared of all clot and debris.  Uterine incision was closed with 2 layers of 0-Monocryl, the 1st of which was running locked and the second as an imbricating layer.  At the uterine incision, a small cervical tension was noted on the left aspect of the incision, this was repaired with the incision and also single suture figure-of- eight of 3-0 Vicryl was used to reinforce this closure.  Pelvic survey performed revealing normal tubes and ovaries.  Clot and debris was removed from the uterine gutters.  The Alexis retractor was removed. The peritoneum was reapproximated using 2-0 Vicryl in  a running fashion. The subfascial plane was inspected and found to be hemostatic.  The fascia was closed with 0-Vicryl from either end overlapping in the midline.  Subcuticular adipose layer was made hemostatic with Bovie cautery.  The dead space was closed with 2-0 plain gut and skin was closed with 4-0 Vicryl on a Keith needle in a subcuticular fashion. Benzoin and Steri-Strips applied.  Sponge, lap, and needle counts were correct x2 per the operating room team.  The patient tolerated the procedure well.     Thornell Sartorius, MD     JB/MEDQ  D:  12/31/2013  T:  01/01/2014  Job:  [id number]

## 2014-01-02 NOTE — Progress Notes (Signed)
Patient ID: Margaret George, female   DOB: June 30, 1976, 38 y.o.   MRN: [id number] #2 afebrile doing well Wants d/c in AM

## 2014-01-03 MED ORDER — IBUPROFEN 800 MG PO TABS: 800.0000 mg | ORAL_TABLET | Freq: Three times a day (TID) | ORAL | Status: DC | PRN

## 2014-01-03 MED ORDER — OXYCODONE-ACETAMINOPHEN 5-325 MG PO TABS: 1.0000 | ORAL_TABLET | Freq: Four times a day (QID) | ORAL | Status: DC | PRN

## 2014-01-03 NOTE — Lactation Note (Addendum)
This note was copied from the chart of Margaret George. Lactation Consultation Note  Patient Name: Margaret George PXTGG'Y Date: 01/03/2014 Reason for consult: Follow-up assessment LC checked latch, and worked with mom to obtain depth and use the breast compressions until the soreness improved .  Per mom the latch felt better. LC noted baby to have a high palate  and a recessed chin. LC suggested for the next 24 hours to have dad assist With latch and ease down chin as the baby is latching , also when latching aim the nipple towards the roof of  the mouth. Baby sustained a consistent pattern for approx.8 mins with multiply swallows, increased with breast compressions. Baby released from the breast and the nipple appeared normal. LC increased flanges to #27 for home due to soreness.  Mom aware of the [facility] and the [facility] O/P services.    Maternal Data Has patient been taught Hand Expression?: Yes  Feeding Feeding Type: Breast Fed Length of feed: 8 min (multiply swallows, increased with breast compressions )  LATCH Score/Interventions Latch: Grasps breast easily, tongue down, lips flanged, rhythmical sucking. (left breast ,football ) Intervention(s): Adjust position;Assist with latch;Breast massage;Breast compression  Audible Swallowing: Spontaneous and intermittent  Type of Nipple: Everted at rest and after stimulation  Comfort (Breast/Nipple): Filling, red/small blisters or bruises, mild/mod discomfort  Problem noted: Filling  Hold (Positioning): Assistance needed to correctly position infant at breast and maintain latch. (worked on depth and breast compressions ) Intervention(s): Breastfeeding basics reviewed;Support Pillows;Position options;Skin to skin  LATCH Score: 8  Lactation Tools Discussed/Used Tools: Flanges Flange Size: 27 Shell Type: Inverted Pump Review: Milk Storage   Consult Status Consult Status: Complete    Myer Haff 01/03/2014, 10:05  AM

## 2014-01-03 NOTE — Lactation Note (Addendum)
This note was copied from the chart of Margaret George. Lactation Consultation Note  Patient Name: Margaret George [name] Date: 01/03/2014 Reason for consult: Follow-up assessment Per mom breast feeding is going well both breast except for nipple tenderness.  LC assessed both nipples and noted some bruising the top of the right nipple  And the left pink and abrasion on top. LC reviewed basics of latching - breast massage , hand express, prepump if needed to prime milk ducts and hen latching use firm support and breast compressions until the baby is in a consistent  Pattern with multiply swallows and then intermittent breast compressions with feeding. Also to use comfort gels after feedings , and the shells. Instructed on use shells at consult.    Maternal Data Has patient been taught Hand Expression?: Yes  Feeding Feeding Type:  (last fed at 0745 per mom ) Length of feed: 15 min  LATCH Score/Interventions          Comfort (Breast/Nipple):  (per mom sore nipples , esp right , see LC note )     Intervention(s): Breastfeeding basics reviewed     Lactation Tools Discussed/Used Tools: Shells (per mom has a pump at home ) Shell Type: Inverted Pump Review: Milk Storage   Consult Status Consult Status: Complete    Myer Haff 01/03/2014, 9:37 AM

## 2014-01-03 NOTE — Discharge Instructions (Signed)
booklet °

## 2014-01-03 NOTE — Progress Notes (Signed)
Patient ID: Margaret George, female   DOB: November 14, 1975, 38 y.o.   MRN: [id number] #3 afebrile no complaints for d/c

## 2014-01-03 NOTE — Discharge Summary (Signed)
NAMEMALEIAH, Margaret George NO.:  [id number]  MEDICAL RECORD NO.:  [id number]  LOCATION:  9106                          FACILITY:  [facility]  PHYSICIAN:  Lucille Passy. Ulanda Edison, M.D. DATE OF BIRTH:  05-14-1976  DATE OF ADMISSION:  12/31/2013 DATE OF DISCHARGE:  01/03/2014                              DISCHARGE SUMMARY   HISTORY OF PRESENT ILLNESS:  This is a 38 year old white female, para 1- 0-0-1, gravida 2 at [redacted] weeks gestation admitted for repeat C-section. The patient underwent a low-transverse cervical C-section by Dr. Melba Coon and Dr. Ulanda Edison assisting under spinal anesthesia.  There were no complications.  Postpartum, the patient did quite well, had normal bowel activity, tolerated a diet, voided well, ambulated well, and on the third postop day, she was ready for discharge.  Postop hemoglobin 10.7, hematocrit 32.1, white count 10,800.  The patient was concerned somewhat about swelling in her feet.  Her weight at her last prenatal visit was 211.  She was weighed on the day of discharge, and the weight was 205. I advised her to watch her fluid intake and watch her salt and to weight herself frequently and if she starts gaining excessively to call.  FINAL DIAGNOSIS:  Intrauterine pregnancy at 40 weeks, delivered by repeat cesarean section.  OPERATION:  Low-transverse cervical C-section.  FINAL CONDITION:  Improved.  INSTRUCTIONS:  Our regular discharge instruction booklet as well as our after visit summary.  Prescriptions for Motrin 800 mg 30 tablets, 1 every 8 hours as needed for pain and Percocet 5/325, 30 tablets, 1 every 6 hours as needed for pain.  The patient is to return to her prenatal vitamins and call the office for an appointment in 2 weeks.     Lucille Passy. Ulanda Edison, M.D.     TFH/MEDQ  D:  01/03/2014  T:  01/03/2014  Job:  [id number]

## 2014-01-03 NOTE — Progress Notes (Signed)
Patient ID: Margaret George, female   DOB: 1976/08/17, 38 y.o.   MRN: [id number] Pt was concerned about swelling in her feet Weight 205 Last weight in the office was 211. Advised to limit salt and weigh herself frequently and call if her weight increases significantly

## 2014-05-21 ENCOUNTER — Ambulatory Visit (INDEPENDENT_AMBULATORY_CARE_PROVIDER_SITE_OTHER): Payer: BC Managed Care – PPO | Admitting: Family Medicine

## 2014-05-21 ENCOUNTER — Encounter: Payer: Self-pay | Admitting: Family Medicine

## 2014-05-21 VITALS — BP 98/64 | HR 82 | Temp 98.6°F | Ht 66.25 in | Wt 193.5 lb

## 2014-05-21 DIAGNOSIS — K625 Hemorrhage of anus and rectum: Secondary | ICD-10-CM

## 2014-05-21 DIAGNOSIS — D62 Acute posthemorrhagic anemia: Secondary | ICD-10-CM | POA: Insufficient documentation

## 2014-05-21 DIAGNOSIS — M25562 Pain in left knee: Principal | ICD-10-CM | POA: Insufficient documentation

## 2014-05-21 DIAGNOSIS — M25561 Pain in right knee: Secondary | ICD-10-CM | POA: Insufficient documentation

## 2014-05-21 DIAGNOSIS — M25569 Pain in unspecified knee: Secondary | ICD-10-CM

## 2014-05-21 DIAGNOSIS — K648 Other hemorrhoids: Secondary | ICD-10-CM | POA: Insufficient documentation

## 2014-05-21 LAB — CBC WITH DIFFERENTIAL/PLATELET
Basophils Absolute: 0.1 10*3/uL (ref 0.0–0.1)
Basophils Relative: 1 % (ref 0–1)
EOS ABS: 0.2 10*3/uL (ref 0.0–0.7)
Eosinophils Relative: 3 % (ref 0–5)
HCT: 38.5 % (ref 36.0–46.0)
HEMOGLOBIN: 13.4 g/dL (ref 12.0–15.0)
LYMPHS ABS: 1.6 10*3/uL (ref 0.7–4.0)
Lymphocytes Relative: 23 % (ref 12–46)
MCH: 28.6 pg (ref 26.0–34.0)
MCHC: 34.8 g/dL (ref 30.0–36.0)
MCV: 82.1 fL (ref 78.0–100.0)
MONO ABS: 0.6 10*3/uL (ref 0.1–1.0)
MONOS PCT: 8 % (ref 3–12)
Neutro Abs: 4.6 10*3/uL (ref 1.7–7.7)
Neutrophils Relative %: 65 % (ref 43–77)
PLATELETS: 191 10*3/uL (ref 150–400)
RBC: 4.69 MIL/uL (ref 3.87–5.11)
RDW: 15.1 % (ref 11.5–15.5)
WBC: 7.1 10*3/uL (ref 4.0–10.5)

## 2014-05-21 MED ORDER — HYDROCORTISONE ACETATE 25 MG RE SUPP: 25.0000 mg | Freq: Every day | RECTAL | Status: DC

## 2014-05-21 NOTE — Progress Notes (Signed)
Subjective:    Patient ID: Margaret George, female    DOB: [birth year], 38 y.o.   MRN: [id number]  HPI Here to est from Dr Melba Coon- [name]  Had a baby in March  Going well -pretty easy baby so far   Has a 78 year old   utd imms  utd pap   bmi is 30 .9   Lab Results  Component Value Date   WBC 10.8* 01/01/2014   HGB 10.7* 01/01/2014   HCT 32.1* 01/01/2014   MCV 87.9 01/01/2014   PLT 133* 01/01/2014   has not had it checked since then  She takes PNV - ? With iron  Did not take iron pills   Is still breastfeeding -- wants to try to do it for a year  camila - does not have periods - no problems with that pill   Eats a healthy diet for the most part  She has had a hard time loosing weight - feels like she is stalling out  Started exercising - a boot camp in the morning (running hurts her knees)  Has been walking also  She has iced her knees   Mother has rheumatoid arthritis   Also having blood in stool Hemorrhoids with both pregnancies (they were very prominent) BRB on the toilet paper  Not using any products  As a rule -not painful with BM  She took stool softeners after her CS  No constipation now   Patient Active Problem List   Diagnosis Date Noted  . Acute blood loss anemia 05/21/2014  . Knee pain, bilateral 05/21/2014  . Rectal bleeding 05/21/2014  . S/P cesarean section 12/31/2013   Past Medical History  Diagnosis Date  . 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 Normal pregnancy 10/11/2011  . Large for gestational age fetus 10/11/2011  . Large for gestational age (LGA) 10/12/2011  . S/P cesarean section 12/31/2013  . History of chicken pox   . History of UTI    Past Surgical History  Procedure Laterality Date  . Breast lumpectomy    . Biopsy breast    . Tooth extraction  1995    wisdom teeth taken out  . Cesarean section  10/13/2011    Procedure: CESAREAN SECTION;  Surgeon: Thornell Sartorius, MD;  Location: [facility];  Service: Gynecology;  Laterality: N/A;  Primary  cesarean section with delivery of baby girl at 94. Apgars 8/9.  Marland Kitchen Cesarean section N/A 12/31/2013    Procedure: REPEAT CESAREAN SECTION;  Surgeon: Thornell Sartorius, MD;  Location: [facility];  Service: Obstetrics;  Laterality: N/A;   History  Substance Use Topics  . Smoking status: Never Smoker   . Smokeless tobacco: Not on file  . Alcohol Use: No   Family History  Problem Relation Age of Onset  . Osteoporosis Mother   . Diverticulitis Other     3 grandparents   . Arthritis/Rheumatoid Mother   . Arthritis Maternal Grandmother   . Hyperlipidemia Father   . Hypertension Mother   . Hypertension Father   . Sudden death Maternal Uncle     ?heart attack vs Stroke  . Diabetes Father     pre diabetic  . Diabetes Paternal Grandmother    Allergies  Allergen Reactions  . Codeine Itching  . Morphine And Related     itching   Current Outpatient Prescriptions on File Prior to Visit  Medication Sig Dispense Refill  . prenatal vitamin w/FE, FA (PRENATAL 1 +  1) 27-1 MG TABS Take 1 tablet by mouth daily.         No current facility-administered medications on file prior to visit.    Review of Systems Review of Systems  Constitutional: Negative for fever, appetite change,  and unexpected weight change. pos for fatigue at times with a new baby Eyes: Negative for pain and visual disturbance.  Respiratory: Negative for cough and shortness of breath.   Cardiovascular: Negative for cp or palpitations    Gastrointestinal: Negative for nausea, diarrhea and constipation. pos for small amt of brb in stool, neg for rectal or abd pain  Genitourinary: Negative for urgency and frequency.  Skin: Negative for pallor or rash  MSK pos for bilat knee pain   Neurological: Negative for weakness, light-headedness, numbness and headaches.  Hematological: Negative for adenopathy. Does not bruise/bleed easily.  Psychiatric/Behavioral: Negative for dysphoric mood. The patient is not nervous/anxious.           Objective:   Physical Exam  Constitutional: She appears well-developed and well-nourished. No distress.  overwt  and well appearing   HENT:  Head: Normocephalic and atraumatic.  Right Ear: External ear normal.  Left Ear: External ear normal.  Mouth/Throat: Oropharynx is clear and moist.  Eyes: Conjunctivae and EOM are normal. Pupils are equal, round, and reactive to light. No scleral icterus.  No pallor  Neck: Normal range of motion. Neck supple. No JVD present. Carotid bruit is not present. No thyromegaly present.  Cardiovascular: Normal rate, regular rhythm and intact distal pulses.  Exam reveals no gallop.   No murmur heard. Pulmonary/Chest: Effort normal and breath sounds normal. No respiratory distress. She has no wheezes. She has no rales.  Abdominal: Soft. Bowel sounds are normal. She exhibits no distension and no mass. There is no tenderness.  Genitourinary: Rectal exam shows internal hemorrhoid. Rectal exam shows no external hemorrhoid, no fissure, no mass, no tenderness and anal tone normal. Guaiac positive stool.  Musculoskeletal: She exhibits tenderness. She exhibits no edema.  bilat knees- nl rom with pain on mcmurray test Stable -neg ant drawer and lachman exams  Neg bounce test  Tender over patellar tendon/ patellofemoral area  Neg patellar grind No swelling or effusion detected  No warmth of skin change  No joint line tenderness   Lymphadenopathy:    She has no cervical adenopathy.  Neurological: She is alert. She has normal reflexes. No cranial nerve deficit. She exhibits normal muscle tone. Coordination normal.  Skin: Skin is warm and dry. No rash noted. No erythema. No pallor.  Psychiatric: She has a normal mood and affect.          Assessment & Plan:   Problem List Items Addressed This Visit     Digestive   Rectal bleeding     Suspect internal hemorrhoids - painless BRB in pt who recently had a baby  Fairly nl exam except for trace heme pos stool  Px  anusol hc suppositories 10-14 d Will update if bleeding does not stop  Disc stool softeners Handout given      Other   Acute blood loss anemia - Primary     From last delivery Re check today    Relevant Orders      CBC with Differential (Completed)   Knee pain, bilateral     Suspect patellofemoral disorder after new aggressive start of exercise  Reassuring exam  Disc use of neoprene patellar stabilizing knee sleeve  Relative rest/ ice /elevation and then slow re  introduction of low impact exercise (no running)  PT may help  Update if not starting to improve in a week or if worsening

## 2014-05-21 NOTE — Progress Notes (Signed)
Pre visit review using our clinic review tool, if applicable. No additional management support is needed unless otherwise documented below in the visit note. 

## 2014-05-21 NOTE — Patient Instructions (Signed)
Keep stools soft  Use the anusol hc suppositories at bedtime for 10-14 days and alert me if symptoms do not resolve  Avoid straining - use a stool softener if necessary  Get a knee sleeve with hole in the middle- wear them when active or exercising  Try some lower impact exercise -swimming and biking  Labs today for anemia

## 2014-05-23 NOTE — Assessment & Plan Note (Signed)
Suspect patellofemoral disorder after new aggressive start of exercise  Reassuring exam  Disc use of neoprene patellar stabilizing knee sleeve  Relative rest/ ice /elevation and then slow re introduction of low impact exercise (no running)  PT may help  Update if not starting to improve in a week or if worsening

## 2014-05-23 NOTE — Assessment & Plan Note (Signed)
Suspect internal hemorrhoids - painless BRB in pt who recently had a baby  Fairly nl exam except for trace heme pos stool  Px anusol hc suppositories 10-14 d Will update if bleeding does not stop  Disc stool softeners Handout given

## 2014-05-23 NOTE — Assessment & Plan Note (Signed)
From last delivery Re check today

## 2014-08-30 ENCOUNTER — Encounter: Payer: Self-pay | Admitting: Family Medicine

## 2014-11-30 ENCOUNTER — Ambulatory Visit (INDEPENDENT_AMBULATORY_CARE_PROVIDER_SITE_OTHER): Payer: BC Managed Care – PPO | Admitting: Family Medicine

## 2014-11-30 ENCOUNTER — Encounter: Payer: Self-pay | Admitting: Family Medicine

## 2014-11-30 VITALS — BP 110/74 | HR 90 | Temp 97.3°F | Wt 189.5 lb

## 2014-11-30 DIAGNOSIS — N926 Irregular menstruation, unspecified: Secondary | ICD-10-CM

## 2014-11-30 DIAGNOSIS — Z3201 Encounter for pregnancy test, result positive: Secondary | ICD-10-CM

## 2014-11-30 DIAGNOSIS — J0100 Acute maxillary sinusitis, unspecified: Secondary | ICD-10-CM

## 2014-11-30 DIAGNOSIS — Z3490 Encounter for supervision of normal pregnancy, unspecified, unspecified trimester: Secondary | ICD-10-CM

## 2014-11-30 LAB — POCT URINE PREGNANCY: PREG TEST UR: POSITIVE

## 2014-11-30 MED ORDER — AMOXICILLIN 875 MG PO TABS: 875.0000 mg | ORAL_TABLET | Freq: Two times a day (BID) | ORAL | Status: DC

## 2014-11-30 NOTE — Patient Instructions (Signed)
Stop your birth control and continue the prenatal vitamins.   Use nasal saline, drink plenty of fluids, and take tylenol if needed for pain.  If not better in a few days then start the amoxil.  Take care.  Glad to see you.

## 2014-11-30 NOTE — Progress Notes (Signed)
Pre visit review using our clinic review tool, if applicable. No additional management support is needed unless otherwise documented below in the visit note.  Normally regular periods.  Hasn't done a pregnancy test.  1 week late.  Still on OCPs.    Teeth are hurting, R>L, facial pain.  Started about 4-5 days ago, more pain today.  No FCNAV.   Mild diarrhea.  Some R ear pain.  ST in AM, possibly from drainage.  No cough.   GEN: nad, alert and oriented HEENT: mucous membranes moist, tm w/o erythema, nasal exam w/o erythema, clear discharge noted,  OP with cobblestoning, R max sinus ttp.  NECK: supple w/o LA CV: rrr.   PULM: ctab, no inc wob EXT: no edema SKIN: no acute rash

## 2014-12-01 DIAGNOSIS — Z3490 Encounter for supervision of normal pregnancy, unspecified, unspecified trimester: Secondary | ICD-10-CM | POA: Insufficient documentation

## 2014-12-01 DIAGNOSIS — J019 Acute sinusitis, unspecified: Secondary | ICD-10-CM | POA: Insufficient documentation

## 2014-12-01 NOTE — Addendum Note (Signed)
Addended by: Tonia Ghent on: 12/01/2014 12:08 PM   Modules accepted: Level of Service

## 2014-12-01 NOTE — Assessment & Plan Note (Signed)
Stop OCP, continue prenatal vitiamin.  She'll call OB clinic re: f/u.  D/w pt.  She agrees.  Routed to PCP as FYI.

## 2014-12-01 NOTE — Assessment & Plan Note (Signed)
Nontoxic, can use tylenol prn.  Warm compresses for facial pain, nasal saline.  Hold amoxil for now, can use if not better in 1-2 days.  She agrees.   Pregnancy cautions on meds d/w pt.  Amoxil and tylenol should be okay.

## 2014-12-17 ENCOUNTER — Ambulatory Visit (INDEPENDENT_AMBULATORY_CARE_PROVIDER_SITE_OTHER): Payer: BC Managed Care – PPO | Admitting: Family Medicine

## 2014-12-17 ENCOUNTER — Encounter: Payer: Self-pay | Admitting: Family Medicine

## 2014-12-17 VITALS — BP 106/66 | HR 83 | Temp 97.4°F | Wt 185.5 lb

## 2014-12-17 DIAGNOSIS — H6691 Otitis media, unspecified, right ear: Secondary | ICD-10-CM

## 2014-12-17 MED ORDER — AMOXICILLIN 875 MG PO TABS: 875.0000 mg | ORAL_TABLET | Freq: Two times a day (BID) | ORAL | Status: AC

## 2014-12-17 NOTE — Progress Notes (Signed)
Pre visit review using our clinic review tool, if applicable. No additional management support is needed unless otherwise documented below in the visit note.  Prev URI resolved, didn't need to take amoxil.  Was back to baseline but then had R ear pain.  R ear pain started last night. Recently with sinus congestion.  No L ear sx.  Hearing is muffled on the R ear.  No fevers, no chills.  Face feels stuffy, but better today than the last few days except for the R ear sx.  ST, mild.  Voice is altered.  Minimal cough.    Pregnant.  Doing well.  No morning sickness, just mild nausea.  No VB, no CTX, no LOF.  She had initial OB appointment.    Meds, vitals, and allergies reviewed.   ROS: See HPI.  Otherwise, noncontributory.  GEN: nad, alert and oriented HEENT: mucous membranes moist, L tm w/o erythema, R TM pink and irritated, nasal exam w/o erythema, clear discharge noted,  OP with cobblestoning NECK: supple w/o LA CV: rrr.   PULM: ctab, no inc wob EXT: no edema SKIN: no acute rash

## 2014-12-17 NOTE — Patient Instructions (Signed)
It looks like you have an early ear infection.  This can sometimes get better without antibiotics.   I would give it another day and see if you feel better.  If not, then start the amoxil.  Take care.

## 2014-12-19 DIAGNOSIS — H6690 Otitis media, unspecified, unspecified ear: Secondary | ICD-10-CM | POA: Insufficient documentation

## 2014-12-19 NOTE — Assessment & Plan Note (Signed)
D/w pt.  Reasonable to hold amoxil for 1-2 days, see if she improves. If not, then start abx.  F/u prn.  Nontoxic. She agrees.

## 2015-01-06 LAB — OB RESULTS CONSOLE HEPATITIS B SURFACE ANTIGEN: HEP B S AG: NEGATIVE

## 2015-01-06 LAB — OB RESULTS CONSOLE HIV ANTIBODY (ROUTINE TESTING): HIV: NONREACTIVE

## 2015-01-06 LAB — OB RESULTS CONSOLE GC/CHLAMYDIA
CHLAMYDIA, DNA PROBE: NEGATIVE
GC PROBE AMP, GENITAL: NEGATIVE

## 2015-01-06 LAB — OB RESULTS CONSOLE RUBELLA ANTIBODY, IGM: Rubella: IMMUNE

## 2015-01-06 LAB — OB RESULTS CONSOLE ABO/RH: RH Type: POSITIVE

## 2015-01-06 LAB — OB RESULTS CONSOLE ANTIBODY SCREEN: Antibody Screen: NEGATIVE

## 2015-01-28 ENCOUNTER — Inpatient Hospital Stay (HOSPITAL_COMMUNITY)
Admission: AD | Admit: 2015-01-28 | Payer: BC Managed Care – PPO | Source: Ambulatory Visit | Admitting: Obstetrics and Gynecology

## 2015-06-20 ENCOUNTER — Inpatient Hospital Stay: Payer: BC Managed Care – PPO | Attending: Internal Medicine | Admitting: Oncology

## 2015-06-20 ENCOUNTER — Inpatient Hospital Stay: Payer: BC Managed Care – PPO

## 2015-06-20 VITALS — BP 115/75 | HR 94 | Temp 97.2°F | Resp 18 | Ht 66.0 in | Wt 209.2 lb

## 2015-06-20 DIAGNOSIS — D696 Thrombocytopenia, unspecified: Secondary | ICD-10-CM

## 2015-06-20 DIAGNOSIS — O99113 Other diseases of the blood and blood-forming organs and certain disorders involving the immune mechanism complicating pregnancy, third trimester: Secondary | ICD-10-CM | POA: Insufficient documentation

## 2015-06-20 LAB — CBC
HEMATOCRIT: 36.7 % (ref 35.0–47.0)
Hemoglobin: 12.6 g/dL (ref 12.0–16.0)
MCH: 29.9 pg (ref 26.0–34.0)
MCHC: 34.2 g/dL (ref 32.0–36.0)
MCV: 87.3 fL (ref 80.0–100.0)
Platelets: 108 10*3/uL — ABNORMAL LOW (ref 150–440)
RBC: 4.21 MIL/uL (ref 3.80–5.20)
RDW: 13.8 % (ref 11.5–14.5)
WBC: 11.4 10*3/uL — ABNORMAL HIGH (ref 3.6–11.0)

## 2015-06-20 LAB — VITAMIN B12: VITAMIN B 12: 267 pg/mL (ref 180–914)

## 2015-06-20 LAB — IRON AND TIBC
IRON: 69 ug/dL (ref 28–170)
Saturation Ratios: 16 % (ref 10.4–31.8)
TIBC: 445 ug/dL (ref 250–450)
UIBC: 376 ug/dL

## 2015-06-20 LAB — FERRITIN: Ferritin: 9 ng/mL — ABNORMAL LOW (ref 11–307)

## 2015-06-20 LAB — FOLATE: Folate: 28 ng/mL (ref >5.9)

## 2015-06-21 LAB — PLATELET ANTIBODY PROFILE, SERUM
HLA Ab Ser Ql EIA: POSITIVE — AB
IA/IIA Antibody: NEGATIVE
IB/IX ANTIBODY: NEGATIVE
IIB/IIIA Antibody: NEGATIVE

## 2015-06-21 LAB — ANA W/REFLEX: ANA: NEGATIVE

## 2015-06-24 NOTE — Progress Notes (Signed)
Margaret George  [phone]) [phone] Fax:(336) [phone]  ID: Margaret George OB: 1976/06/12  MR#: [id number]  JJH#:[id number]  Patient Care Team: Abner Greenspan, MD as PCP - General (Family Medicine)  CHIEF COMPLAINT:  Chief Complaint  Patient presents with  . New Evaluation    thrombocytopenia; no complaints today    INTERVAL HISTORY: Patient is a 39 year old female who was found to have a decreased platelet count in her third trimester pregnancy. She currently feels well and is asymptomatic. She denies any easy bleeding or bruising. She has no neurologic complaints. She denies any recent fevers or illnesses. She is gaining weight appropriately. She denies any chest pain or shortness of breath. She denies any nausea, vomiting, constipation, or diarrhea. She has no urinary complaints. Patient offers no specific complaints today.  REVIEW OF SYSTEMS:   Review of Systems  Constitutional: Negative.   Respiratory: Negative.   Cardiovascular: Negative.   Endo/Heme/Allergies: Does not bruise/bleed easily.    As per HPI. Otherwise, a complete review of systems is negatve.  PAST MEDICAL HISTORY: Past Medical History  Diagnosis Date  . 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 Normal pregnancy 10/11/2011  . Large for gestational age fetus 10/11/2011  . Large for gestational age (LGA) 10/12/2011  . S/P cesarean section 12/31/2013  . History of chicken pox   . History of UTI     PAST SURGICAL HISTORY: Past Surgical History  Procedure Laterality Date  . Breast lumpectomy    . Biopsy breast    . Tooth extraction  1995    wisdom teeth taken out  . Cesarean section  10/13/2011    Procedure: CESAREAN SECTION;  Surgeon: Thornell Sartorius, MD;  Location: [facility];  Service: Gynecology;  Laterality: N/A;  Primary cesarean section with delivery of baby girl at 30. Apgars 8/9.  Marland Kitchen Cesarean section N/A 12/31/2013    Procedure: REPEAT CESAREAN SECTION;  Surgeon: Thornell Sartorius, MD;   Location: [facility];  Service: Obstetrics;  Laterality: N/A;    FAMILY HISTORY Family History  Problem Relation Age of Onset  . Osteoporosis Mother   . Diverticulitis Other     3 grandparents   . Arthritis/Rheumatoid Mother   . Arthritis Maternal Grandmother   . Hyperlipidemia Father   . Hypertension Mother   . Hypertension Father   . Sudden death Maternal Uncle     ?heart attack vs Stroke  . Diabetes Father     pre diabetic  . Diabetes Paternal Grandmother        ADVANCED DIRECTIVES:    HEALTH MAINTENANCE: Social History  Substance Use Topics  . Smoking status: Never Smoker   . Smokeless tobacco: Not on file  . Alcohol Use: No     Colonoscopy:  PAP:  Bone density:  Lipid panel:  Allergies  Allergen Reactions  . Codeine Itching  . Morphine And Related     itching    Current Outpatient Prescriptions  Medication Sig Dispense Refill  . prenatal vitamin w/FE, FA (PRENATAL 1 + 1) 27-1 MG TABS Take 1 tablet by mouth daily.       No current facility-administered medications for this visit.    OBJECTIVE: Filed Vitals:   06/20/15 1452  BP: 115/75  Pulse: 94  Temp: 97.2 F (36.2 C)  Resp: 18     Body mass index is 33.78 kg/(m^2).    ECOG FS:0 - Asymptomatic  General: Well-developed, well-nourished, no acute distress. Eyes: Pink conjunctiva, anicteric sclera.  HEENT: Normocephalic, moist mucous membranes, clear oropharnyx. Lungs: Clear to auscultation bilaterally. Heart: Regular rate and rhythm. No rubs, murmurs, or gallops. Abdomen: Appears appropriate for gestational age. Musculoskeletal: No edema, cyanosis, or clubbing. Neuro: Alert, answering all questions appropriately. Cranial nerves grossly intact. Skin: No rashes or petechiae noted. Psych: Normal affect. Lymphatics: No cervical, calvicular, axillary or inguinal LAD.   LAB RESULTS:  No results found for: NA, K, CL, CO2, GLUCOSE, BUN, CREATININE, CALCIUM, PROT, ALBUMIN, AST, ALT, ALKPHOS, BILITOT,  GFRNONAA, GFRAA  Lab Results  Component Value Date   WBC 11.4* 06/20/2015   NEUTROABS 4.6 05/21/2014   HGB 12.6 06/20/2015   HCT 36.7 06/20/2015   MCV 87.3 06/20/2015   PLT 108* 06/20/2015     STUDIES: No results found.  ASSESSMENT: Thrombocytopenia in pregnancy.  PLAN:    1. Thrombocytopenia: Patient's platelet count has slowly decreased and is now 108. No intervention is needed at this time. Patient was found to have a positive platelet antibody, but these can be transient in nature. The remainder of her laboratory work was either negative or within normal limits. Patient will have a goal platelet count of approximately 100 at the time of her due date, August 01, 2015. Patient will return to clinic approximately one week prior to her due date for repeat laboratory work and further evaluation. If her platelet count continues to drop, we will give her a short course of prednisone prior to giving birth.   Patient expressed understanding and was in agreement with this plan. She also understands that She can call clinic at any time with any questions, concerns, or complaints.    Lloyd Huger, MD   06/24/2015 4:05 PM

## 2015-06-29 ENCOUNTER — Encounter: Payer: Self-pay | Admitting: Family Medicine

## 2015-07-25 ENCOUNTER — Inpatient Hospital Stay (HOSPITAL_BASED_OUTPATIENT_CLINIC_OR_DEPARTMENT_OTHER): Payer: BC Managed Care – PPO | Admitting: Oncology

## 2015-07-25 ENCOUNTER — Inpatient Hospital Stay: Payer: BC Managed Care – PPO | Attending: Oncology | Admitting: *Deleted

## 2015-07-25 VITALS — BP 101/67 | HR 80 | Temp 97.2°F | Resp 16 | Wt 214.3 lb

## 2015-07-25 DIAGNOSIS — D696 Thrombocytopenia, unspecified: Secondary | ICD-10-CM | POA: Diagnosis not present

## 2015-07-25 DIAGNOSIS — Z79899 Other long term (current) drug therapy: Secondary | ICD-10-CM | POA: Diagnosis not present

## 2015-07-25 DIAGNOSIS — O99113 Other diseases of the blood and blood-forming organs and certain disorders involving the immune mechanism complicating pregnancy, third trimester: Secondary | ICD-10-CM | POA: Insufficient documentation

## 2015-07-25 LAB — CBC WITH DIFFERENTIAL/PLATELET
BASOS ABS: 0 10*3/uL (ref 0–0.1)
Basophils Relative: 1 %
Eosinophils Absolute: 0.1 10*3/uL (ref 0–0.7)
Eosinophils Relative: 1 %
HEMATOCRIT: 38.2 % (ref 35.0–47.0)
Hemoglobin: 12.8 g/dL (ref 12.0–16.0)
Lymphocytes Relative: 12 %
Lymphs Abs: 1.2 10*3/uL (ref 1.0–3.6)
MCH: 29.5 pg (ref 26.0–34.0)
MCHC: 33.4 g/dL (ref 32.0–36.0)
MCV: 88.3 fL (ref 80.0–100.0)
MONO ABS: 0.7 10*3/uL (ref 0.2–0.9)
Monocytes Relative: 7 %
NEUTROS ABS: 7.6 10*3/uL — AB (ref 1.4–6.5)
Neutrophils Relative %: 79 %
Platelets: 104 10*3/uL — ABNORMAL LOW (ref 150–440)
RBC: 4.33 MIL/uL (ref 3.80–5.20)
RDW: 13.6 % (ref 11.5–14.5)
WBC: 9.6 10*3/uL (ref 3.6–11.0)

## 2015-07-25 NOTE — Progress Notes (Signed)
Patient is scheduled for C-section on 08/01/15.

## 2015-07-26 LAB — ANA W/REFLEX: Anti Nuclear Antibody(ANA): NEGATIVE

## 2015-07-27 ENCOUNTER — Encounter (HOSPITAL_COMMUNITY): Payer: Self-pay

## 2015-07-29 ENCOUNTER — Encounter (HOSPITAL_COMMUNITY)
Admission: RE | Admit: 2015-07-29 | Discharge: 2015-07-29 | Disposition: A | Discharge status: Home / Self Care | Payer: BC Managed Care – PPO | Source: Ambulatory Visit | Attending: Obstetrics and Gynecology | Admitting: Obstetrics and Gynecology

## 2015-07-29 ENCOUNTER — Encounter (HOSPITAL_COMMUNITY): Payer: Self-pay

## 2015-07-29 LAB — CBC
HEMATOCRIT: 36.4 % (ref 36.0–46.0)
Hemoglobin: 12.2 g/dL (ref 12.0–15.0)
MCH: 30 pg (ref 26.0–34.0)
MCHC: 33.5 g/dL (ref 30.0–36.0)
MCV: 89.4 fL (ref 78.0–100.0)
Platelets: 107 10*3/uL — ABNORMAL LOW (ref 150–400)
RBC: 4.07 MIL/uL (ref 3.87–5.11)
RDW: 13.9 % (ref 11.5–15.5)
WBC: 9.7 10*3/uL (ref 4.0–10.5)

## 2015-07-29 LAB — TYPE AND SCREEN
ABO/RH(D): O POS
Antibody Screen: NEGATIVE

## 2015-07-29 NOTE — Pre-Procedure Instructions (Signed)
Patient's CBC was not back at 5:38 pm on Friday afternoon. Spoke with Margaret George in the lab and she was running the platelet count again because it was low. Patient mentioned at her PAT appt that she had labs done earlier in the week due to her platelet count being low. Spoke with Dr. Royce Macadamia and received orders for repeat CBC on Monday before surgery.

## 2015-07-29 NOTE — Patient Instructions (Signed)
Your procedure is scheduled on: August 01, 2015   Enter through the Main Entrance of [facility] at:  6:00 am   Pick up the phone at the desk and dial [phone].  Call this number if you have problems the morning of surgery: [phone].  Remember: Do NOT eat food: after midnight on Sunday  Do NOT drink clear liquids after: midnight on Sunday  Take these medicines the morning of surgery with a SIP OF WATER: none   Do NOT wear jewelry (body piercing), metal hair clips/bobby pins, or nail polish. Do NOT wear lotions, powders, or perfumes.  You may wear deoderant. Do NOT shave for 48 hours prior to surgery. Do NOT bring valuables to the hospital. Leave suitcase in car.  After surgery it may be brought to your room.  For patients admitted to the hospital, checkout time is 11:00 AM the day of discharge.

## 2015-07-30 LAB — RPR: RPR: NONREACTIVE

## 2015-07-30 NOTE — H&P (Signed)
Margaret George is a 39 y.o. female G3P2002 at 39+ for rLTCS.  Pt for repeat LTCS, has h/o LTCS x 2; This fetus has been followed for growth by [place], latest 2 wk ago EFW = 88%, 8#6.  End of pregnancy complicated by thrombocytopenia - last plt = 107, has had eval with hemeonc.  Pregnancy also AMA with lowrisk Panorama, female.    Maternal Medical History:  Contractions: Frequency: irregular.    Fetal activity: Perceived fetal activity is normal.    Prenatal complications: Thrombocytopenia.   Prenatal Complications - Diabetes: none.    OB History    Gravida Para Term Preterm AB TAB SAB Ectopic Multiple Living   3 2 2       2     G1 10#4 LTCS, CPD, female G2 rLTCS 9#2 female G3 present  H/o abn pap, last 5/12, HR HPV neg No STD  Past Medical History  Diagnosis Date  . 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 Normal pregnancy 10/11/2011  . Large for gestational age fetus 10/11/2011  . Large for gestational age (LGA) 10/12/2011  . S/P cesarean section 12/31/2013  . History of chicken pox   . History of UTI   GERD, allergies  Past Surgical History  Procedure Laterality Date  . Breast lumpectomy    . Biopsy breast    . Tooth extraction  1995    wisdom teeth taken out  . Cesarean section  10/13/2011    Procedure: CESAREAN SECTION;  Surgeon: Thornell Sartorius, MD;  Location: [facility];  Service: Gynecology;  Laterality: N/A;  Primary cesarean section with delivery of baby girl at 85. Apgars 8/9.  Marland Kitchen Cesarean section N/A 12/31/2013    Procedure: REPEAT CESAREAN SECTION;  Surgeon: Thornell Sartorius, MD;  Location: [facility];  Service: Obstetrics;  Laterality: N/A;  . Wisdom tooth extraction     Family History: family history includes Arthritis in her maternal grandmother; Arthritis/Rheumatoid in her mother; Diabetes in her father and paternal grandmother; Diverticulitis in her other; Hyperlipidemia in her father; Hypertension in her father and mother; Osteoporosis in her mother; Sudden death in her  maternal uncle. Social History:  reports that she has never smoked. She has never used smokeless tobacco. She reports that she does not drink alcohol or use illicit drugs. married, Pharmacist, hospital Meds [employer]   Prenatal Transfer Tool  Maternal Diabetes: No Genetic Screening: Normal Maternal Ultrasounds/Referrals: Abnormal:  Findings:   Other:isolated CP cyst Fetal Ultrasounds or other Referrals:  None Maternal Substance Abuse:  No Significant Maternal Medications:  None Significant Maternal Lab Results:  Lab values include: Group B Strep positive Other Comments:  Panorama - low risk female;   Review of Systems  Constitutional: Negative.   HENT: Negative.   Eyes: Negative.   Respiratory: Negative.   Cardiovascular: Negative.   Gastrointestinal: Negative.   Genitourinary: Negative.   Musculoskeletal: Negative.   Skin: Negative.   Neurological: Negative.   Psychiatric/Behavioral: Negative.       currently breastfeeding. Maternal Exam:  Uterine Assessment: Contraction frequency is irregular.   Abdomen: Surgical scars: low transverse.   Fundal height is appropriate for gestation.   Estimated fetal weight is 9-10#.   Fetal presentation: vertex  Introitus: Normal vulva. Normal vagina.    Physical Exam  Constitutional: She is oriented to person, place, and time. She appears well-developed and well-nourished.  HENT:  Head: Normocephalic and atraumatic.  Cardiovascular: Normal rate and regular rhythm.   Respiratory: Effort normal and  breath sounds normal. No respiratory distress. She has no wheezes.  GI: Soft. Bowel sounds are normal. She exhibits no distension. There is no tenderness.  Musculoskeletal: Normal range of motion.  Neurological: She is alert and oriented to person, place, and time.  Skin: Skin is warm and dry.  Psychiatric: She has a normal mood and affect. Her behavior is normal.    Prenatal labs: ABO, Rh: --/--/O POS (09/30 1605) Antibody: NEG (09/30  1605) Rubella: Immune (03/10 0000) RPR:   NR HBsAg: Negative (03/10 0000)  HIV: Non-reactive (03/10 0000)  GBS:   positive  Hgb 13.0/Plt 168 - 131 - 112-107; Ur Cx neg, GC neg/ Chl neg/nl Hgb electro/AFP WNL/ [place] nl NT; nl anat, CP cyst, post plac; 88% growth, 8#6, fundal plac  Tdap 7/16; Flu 8/16  Assessment/Plan: 38yo [id number] @ 39+ for rLTCS  Thrombocytopenia - plts stable rLTCS - d/w pt r/b/a Ancef for surgical prophylaxis  Margaret George, Margaret George 07/30/2015, 12:08 AM

## 2015-08-01 ENCOUNTER — Inpatient Hospital Stay (HOSPITAL_COMMUNITY): Payer: BC Managed Care – PPO | Admitting: Anesthesiology

## 2015-08-01 ENCOUNTER — Encounter (HOSPITAL_COMMUNITY): Payer: Self-pay | Admitting: Anesthesiology

## 2015-08-01 ENCOUNTER — Encounter (HOSPITAL_COMMUNITY)
Admission: RE | Disposition: A | Discharge status: Home / Self Care | Payer: Self-pay | Source: Ambulatory Visit | Attending: Obstetrics and Gynecology

## 2015-08-01 ENCOUNTER — Inpatient Hospital Stay (HOSPITAL_COMMUNITY)
Admission: RE | Admit: 2015-08-01 | Discharge: 2015-08-03 | DRG: 765 | Disposition: A | Discharge status: Home / Self Care | Payer: BC Managed Care – PPO | Source: Ambulatory Visit | Attending: Obstetrics and Gynecology | Admitting: Obstetrics and Gynecology

## 2015-08-01 DIAGNOSIS — Z8261 Family history of arthritis: Secondary | ICD-10-CM

## 2015-08-01 DIAGNOSIS — Z98891 History of uterine scar from previous surgery: Secondary | ICD-10-CM

## 2015-08-01 DIAGNOSIS — O99824 Streptococcus B carrier state complicating childbirth: Secondary | ICD-10-CM | POA: Diagnosis present

## 2015-08-01 DIAGNOSIS — Z8249 Family history of ischemic heart disease and other diseases of the circulatory system: Secondary | ICD-10-CM | POA: Diagnosis not present

## 2015-08-01 DIAGNOSIS — O9912 Other diseases of the blood and blood-forming organs and certain disorders involving the immune mechanism complicating childbirth: Secondary | ICD-10-CM | POA: Diagnosis present

## 2015-08-01 DIAGNOSIS — Z3A39 39 weeks gestation of pregnancy: Secondary | ICD-10-CM

## 2015-08-01 DIAGNOSIS — O34211 Maternal care for low transverse scar from previous cesarean delivery: Secondary | ICD-10-CM | POA: Diagnosis present

## 2015-08-01 LAB — CBC
HEMATOCRIT: 36.6 % (ref 36.0–46.0)
Hemoglobin: 12.2 g/dL (ref 12.0–15.0)
MCH: 29.5 pg (ref 26.0–34.0)
MCHC: 33.3 g/dL (ref 30.0–36.0)
MCV: 88.6 fL (ref 78.0–100.0)
PLATELETS: 103 10*3/uL — AB (ref 150–400)
RBC: 4.13 MIL/uL (ref 3.87–5.11)
RDW: 14.1 % (ref 11.5–15.5)
WBC: 8.4 10*3/uL (ref 4.0–10.5)

## 2015-08-01 SURGERY — Surgical Case
Anesthesia: Spinal

## 2015-08-01 MED ORDER — PHENYLEPHRINE 40 MCG/ML (10ML) SYRINGE FOR IV PUSH (FOR BLOOD PRESSURE SUPPORT)
PREFILLED_SYRINGE | INTRAVENOUS | Status: AC
  Filled 2015-08-01: qty 10

## 2015-08-01 MED ORDER — DIBUCAINE 1 % RE OINT: 1.0000 "application " | TOPICAL_OINTMENT | RECTAL | Status: DC | PRN

## 2015-08-01 MED ORDER — CALCIUM CARBONATE ANTACID 500 MG PO CHEW
1.0000 | CHEWABLE_TABLET | Freq: Every day | ORAL | Status: DC
  Administered 2015-08-02 – 2015-08-03 (×2): 200 mg via ORAL
  Filled 2015-08-01 (×2): qty 1

## 2015-08-01 MED ORDER — SCOPOLAMINE 1 MG/3DAYS TD PT72
MEDICATED_PATCH | TRANSDERMAL | Status: AC
  Filled 2015-08-01: qty 1

## 2015-08-01 MED ORDER — PRENATAL MULTIVITAMIN CH
1.0000 | ORAL_TABLET | Freq: Every day | ORAL | Status: DC
  Administered 2015-08-01 – 2015-08-02 (×2): 1 via ORAL
  Filled 2015-08-01 (×2): qty 1

## 2015-08-01 MED ORDER — FENTANYL CITRATE (PF) 100 MCG/2ML IJ SOLN
INTRAMUSCULAR | Status: DC | PRN
  Administered 2015-08-01: 25 ug via INTRATHECAL

## 2015-08-01 MED ORDER — SCOPOLAMINE 1 MG/3DAYS TD PT72
1.0000 | MEDICATED_PATCH | Freq: Once | TRANSDERMAL | Status: DC
  Administered 2015-08-01: 1.5 mg via TRANSDERMAL

## 2015-08-01 MED ORDER — ACETAMINOPHEN 500 MG PO TABS
1000.0000 mg | ORAL_TABLET | Freq: Four times a day (QID) | ORAL | Status: AC
  Administered 2015-08-01 – 2015-08-02 (×2): 1000 mg via ORAL
  Filled 2015-08-01 (×2): qty 2

## 2015-08-01 MED ORDER — SODIUM CHLORIDE 0.9 % IJ SOLN: 3.0000 mL | INTRAMUSCULAR | Status: DC | PRN

## 2015-08-01 MED ORDER — ONDANSETRON HCL 4 MG/2ML IJ SOLN
INTRAMUSCULAR | Status: DC | PRN
  Administered 2015-08-01: 4 mg via INTRAVENOUS

## 2015-08-01 MED ORDER — NALBUPHINE HCL 10 MG/ML IJ SOLN
INTRAMUSCULAR | Status: AC
  Administered 2015-08-01: 5 mg via INTRAVENOUS
  Filled 2015-08-01: qty 1

## 2015-08-01 MED ORDER — WITCH HAZEL-GLYCERIN EX PADS: 1.0000 "application " | MEDICATED_PAD | CUTANEOUS | Status: DC | PRN

## 2015-08-01 MED ORDER — LACTATED RINGERS IV SOLN
INTRAVENOUS | Status: DC
  Administered 2015-08-01 (×3): via INTRAVENOUS

## 2015-08-01 MED ORDER — MORPHINE SULFATE (PF) 0.5 MG/ML IJ SOLN
INTRAMUSCULAR | Status: DC | PRN
  Administered 2015-08-01: .1 mg via INTRATHECAL

## 2015-08-01 MED ORDER — NALBUPHINE HCL 10 MG/ML IJ SOLN
5.0000 mg | INTRAMUSCULAR | Status: DC | PRN
  Administered 2015-08-01 – 2015-08-02 (×2): 5 mg via INTRAVENOUS
  Filled 2015-08-01 (×5): qty 0.5

## 2015-08-01 MED ORDER — NALOXONE HCL 0.4 MG/ML IJ SOLN: 0.4000 mg | INTRAMUSCULAR | Status: DC | PRN

## 2015-08-01 MED ORDER — LANOLIN HYDROUS EX OINT: 1.0000 "application " | TOPICAL_OINTMENT | CUTANEOUS | Status: DC | PRN

## 2015-08-01 MED ORDER — OXYCODONE-ACETAMINOPHEN 5-325 MG PO TABS: 2.0000 | ORAL_TABLET | ORAL | Status: DC | PRN

## 2015-08-01 MED ORDER — PHENYLEPHRINE 8 MG IN D5W 100 ML (0.08MG/ML) PREMIX OPTIME
INJECTION | INTRAVENOUS | Status: DC | PRN
  Administered 2015-08-01: 60 ug/min via INTRAVENOUS

## 2015-08-01 MED ORDER — ZOLPIDEM TARTRATE 5 MG PO TABS: 5.0000 mg | ORAL_TABLET | Freq: Every evening | ORAL | Status: DC | PRN

## 2015-08-01 MED ORDER — NALBUPHINE HCL 10 MG/ML IJ SOLN: 5.0000 mg | Freq: Once | INTRAMUSCULAR | Status: AC | PRN

## 2015-08-01 MED ORDER — CEFAZOLIN SODIUM-DEXTROSE 2-3 GM-% IV SOLR
INTRAVENOUS | Status: AC
Start: 2015-08-01 — End: 2015-08-01
  Administered 2015-08-01: 2 g via INTRAVENOUS
  Filled 2015-08-01: qty 50

## 2015-08-01 MED ORDER — NALBUPHINE HCL 10 MG/ML IJ SOLN
5.0000 mg | INTRAMUSCULAR | Status: DC | PRN
  Administered 2015-08-01: 5 mg via SUBCUTANEOUS
  Filled 2015-08-01: qty 0.5

## 2015-08-01 MED ORDER — SCOPOLAMINE 1 MG/3DAYS TD PT72: 1.0000 | MEDICATED_PATCH | Freq: Once | TRANSDERMAL | Status: DC

## 2015-08-01 MED ORDER — ONDANSETRON HCL 4 MG/2ML IJ SOLN: 4.0000 mg | Freq: Three times a day (TID) | INTRAMUSCULAR | Status: DC | PRN

## 2015-08-01 MED ORDER — ONDANSETRON HCL 4 MG/2ML IJ SOLN
INTRAMUSCULAR | Status: AC
  Filled 2015-08-01: qty 2

## 2015-08-01 MED ORDER — DIPHENHYDRAMINE HCL 25 MG PO CAPS: 25.0000 mg | ORAL_CAPSULE | Freq: Four times a day (QID) | ORAL | Status: DC | PRN

## 2015-08-01 MED ORDER — OXYTOCIN 10 UNIT/ML IJ SOLN
INTRAMUSCULAR | Status: AC
  Filled 2015-08-01: qty 4

## 2015-08-01 MED ORDER — PHENYLEPHRINE HCL 10 MG/ML IJ SOLN
INTRAMUSCULAR | Status: DC | PRN
  Administered 2015-08-01: 80 ug via INTRAVENOUS

## 2015-08-01 MED ORDER — NALOXONE HCL 1 MG/ML IJ SOLN
1.0000 ug/kg/h | INTRAMUSCULAR | Status: DC | PRN
Start: 2015-08-01 — End: 2015-08-03
  Filled 2015-08-01: qty 2

## 2015-08-01 MED ORDER — LACTATED RINGERS IV SOLN: INTRAVENOUS | Status: DC

## 2015-08-01 MED ORDER — IBUPROFEN 800 MG PO TABS
800.0000 mg | ORAL_TABLET | Freq: Three times a day (TID) | ORAL | Status: DC
  Administered 2015-08-01 – 2015-08-03 (×6): 800 mg via ORAL
  Filled 2015-08-01 (×6): qty 1

## 2015-08-01 MED ORDER — MENTHOL 3 MG MT LOZG: 1.0000 | LOZENGE | OROMUCOSAL | Status: DC | PRN

## 2015-08-01 MED ORDER — LACTATED RINGERS IV SOLN
INTRAVENOUS | Status: DC | PRN
  Administered 2015-08-01: 08:00:00 via INTRAVENOUS

## 2015-08-01 MED ORDER — MORPHINE SULFATE (PF) 0.5 MG/ML IJ SOLN
INTRAMUSCULAR | Status: AC
  Filled 2015-08-01: qty 100

## 2015-08-01 MED ORDER — ACETAMINOPHEN 325 MG PO TABS
650.0000 mg | ORAL_TABLET | ORAL | Status: DC | PRN
  Administered 2015-08-01: 650 mg via ORAL
  Filled 2015-08-01: qty 2

## 2015-08-01 MED ORDER — NALBUPHINE HCL 10 MG/ML IJ SOLN
5.0000 mg | Freq: Once | INTRAMUSCULAR | Status: AC | PRN
  Administered 2015-08-01: 5 mg via SUBCUTANEOUS

## 2015-08-01 MED ORDER — SIMETHICONE 80 MG PO CHEW
80.0000 mg | CHEWABLE_TABLET | Freq: Three times a day (TID) | ORAL | Status: DC
  Administered 2015-08-01 – 2015-08-03 (×5): 80 mg via ORAL
  Filled 2015-08-01 (×6): qty 1

## 2015-08-01 MED ORDER — CEFAZOLIN SODIUM-DEXTROSE 2-3 GM-% IV SOLR: 2.0000 g | INTRAVENOUS | Status: DC

## 2015-08-01 MED ORDER — MEPERIDINE HCL 25 MG/ML IJ SOLN: 6.2500 mg | INTRAMUSCULAR | Status: DC | PRN

## 2015-08-01 MED ORDER — PHENYLEPHRINE 8 MG IN D5W 100 ML (0.08MG/ML) PREMIX OPTIME
INJECTION | INTRAVENOUS | Status: AC
  Filled 2015-08-01: qty 100

## 2015-08-01 MED ORDER — SIMETHICONE 80 MG PO CHEW
80.0000 mg | CHEWABLE_TABLET | ORAL | Status: DC
  Administered 2015-08-01 – 2015-08-02 (×2): 80 mg via ORAL
  Filled 2015-08-01 (×2): qty 1

## 2015-08-01 MED ORDER — SIMETHICONE 80 MG PO CHEW
80.0000 mg | CHEWABLE_TABLET | ORAL | Status: DC | PRN
Start: 2015-08-01 — End: 2015-08-03

## 2015-08-01 MED ORDER — OXYTOCIN 40 UNITS IN LACTATED RINGERS INFUSION - SIMPLE MED
INTRAVENOUS | Status: DC | PRN
  Administered 2015-08-01: 40 [IU] via INTRAVENOUS

## 2015-08-01 MED ORDER — OXYTOCIN 40 UNITS IN LACTATED RINGERS INFUSION - SIMPLE MED: 62.5000 mL/h | INTRAVENOUS | Status: AC

## 2015-08-01 MED ORDER — DIPHENHYDRAMINE HCL 50 MG/ML IJ SOLN: 12.5000 mg | INTRAMUSCULAR | Status: DC | PRN

## 2015-08-01 MED ORDER — SENNOSIDES-DOCUSATE SODIUM 8.6-50 MG PO TABS
2.0000 | ORAL_TABLET | ORAL | Status: DC
Start: 2015-08-02 — End: 2015-08-03
  Administered 2015-08-01 – 2015-08-02 (×2): 2 via ORAL
  Filled 2015-08-01 (×2): qty 2

## 2015-08-01 MED ORDER — OXYCODONE-ACETAMINOPHEN 5-325 MG PO TABS
1.0000 | ORAL_TABLET | ORAL | Status: DC | PRN
  Administered 2015-08-02 – 2015-08-03 (×5): 1 via ORAL
  Filled 2015-08-01 (×5): qty 1

## 2015-08-01 MED ORDER — FENTANYL CITRATE (PF) 100 MCG/2ML IJ SOLN
INTRAMUSCULAR | Status: AC
  Filled 2015-08-01: qty 4

## 2015-08-01 MED ORDER — DIPHENHYDRAMINE HCL 25 MG PO CAPS
25.0000 mg | ORAL_CAPSULE | ORAL | Status: DC | PRN
  Administered 2015-08-01 (×2): 25 mg via ORAL
  Filled 2015-08-01 (×2): qty 1

## 2015-08-01 MED ORDER — FENTANYL CITRATE (PF) 100 MCG/2ML IJ SOLN: 25.0000 ug | INTRAMUSCULAR | Status: DC | PRN

## 2015-08-01 SURGICAL SUPPLY — 35 items
BENZOIN TINCTURE PRP APPL 2/3 (GAUZE/BANDAGES/DRESSINGS) ×3 IMPLANT
CLAMP CORD UMBIL (MISCELLANEOUS) IMPLANT
CLOSURE STERI STRIP 1/2 X4 (GAUZE/BANDAGES/DRESSINGS) ×3 IMPLANT
CLOTH BEACON ORANGE TIMEOUT ST (SAFETY) ×3 IMPLANT
CONTAINER PREFILL 10% NBF 15ML (MISCELLANEOUS) IMPLANT
DRAPE SHEET LG 3/4 BI-LAMINATE (DRAPES) IMPLANT
DRSG OPSITE POSTOP 4X10 (GAUZE/BANDAGES/DRESSINGS) ×3 IMPLANT
DURAPREP 26ML APPLICATOR (WOUND CARE) ×3 IMPLANT
ELECT REM PT RETURN 9FT ADLT (ELECTROSURGICAL) ×3
ELECTRODE REM PT RTRN 9FT ADLT (ELECTROSURGICAL) ×1 IMPLANT
EXTRACTOR VACUUM M CUP 4 TUBE (SUCTIONS) IMPLANT
EXTRACTOR VACUUM M CUP 4' TUBE (SUCTIONS)
GLOVE BIO SURGEON STRL SZ 6.5 (GLOVE) ×2 IMPLANT
GLOVE BIO SURGEONS STRL SZ 6.5 (GLOVE) ×1
GOWN STRL REUS W/TWL LRG LVL3 (GOWN DISPOSABLE) ×6 IMPLANT
KIT ABG SYR 3ML LUER SLIP (SYRINGE) IMPLANT
NEEDLE HYPO 25X5/8 SAFETYGLIDE (NEEDLE) IMPLANT
NS IRRIG 1000ML POUR BTL (IV SOLUTION) ×3 IMPLANT
PACK C SECTION WH (CUSTOM PROCEDURE TRAY) ×3 IMPLANT
PAD OB MATERNITY 4.3X12.25 (PERSONAL CARE ITEMS) ×3 IMPLANT
PENCIL SMOKE EVAC W/HOLSTER (ELECTROSURGICAL) IMPLANT
RTRCTR C-SECT PINK 25CM LRG (MISCELLANEOUS) ×6 IMPLANT
STRIP CLOSURE SKIN 1/2X4 (GAUZE/BANDAGES/DRESSINGS) ×2 IMPLANT
SUT MNCRL 0 VIOLET CTX 36 (SUTURE) ×2 IMPLANT
SUT MONOCRYL 0 CTX 36 (SUTURE) ×4
SUT PLAIN 1 NONE 54 (SUTURE) IMPLANT
SUT PLAIN 2 0 XLH (SUTURE) ×3 IMPLANT
SUT VIC AB 0 CT1 27 (SUTURE) ×4
SUT VIC AB 0 CT1 27XBRD ANBCTR (SUTURE) ×2 IMPLANT
SUT VIC AB 2-0 CT1 27 (SUTURE) ×2
SUT VIC AB 2-0 CT1 TAPERPNT 27 (SUTURE) ×1 IMPLANT
SUT VIC AB 4-0 KS 27 (SUTURE) ×3 IMPLANT
SYR BULB IRRIGATION 50ML (SYRINGE) ×3 IMPLANT
TOWEL OR 17X24 6PK STRL BLUE (TOWEL DISPOSABLE) ×3 IMPLANT
TRAY FOLEY CATH SILVER 14FR (SET/KITS/TRAYS/PACK) ×3 IMPLANT

## 2015-08-01 NOTE — Interval H&P Note (Signed)
History and Physical Interval Note:  08/01/2015 7:03 AM  Margaret George  has presented today for surgery, with the diagnosis of Repeat C/Section  The various methods of treatment have been discussed with the patient and family. After consideration of risks, benefits and other options for treatment, the patient has consented to  Procedure(s): CESAREAN SECTION (N/A) as a surgical intervention .  The patient's history has been reviewed, patient examined, no change in status, stable for surgery.  I have reviewed the patient's chart and labs.  Questions were answered to the patient's satisfaction.     Bovard-Stuckert, Laiken Sandy

## 2015-08-01 NOTE — Lactation Note (Signed)
This note was copied from the chart of Margaret Raniyah Curenton. Lactation Consultation Note  Initial Consult with P3 mom who BF older 2 for 8 months each. She did have sore/cracked nipples post D/C. Mom with evert nipples and soft breasts. Infant was cueing to feed although mom reported infant had just fed for 1 hour. Assisted mom with latch to left breast in cradle hold. Infant latched easily requiring flanging of upper lip. Baby did want to sleep but was easily stimulated to feed, enc mom to keep awake for feed. A few intermittent swallows were heard. Enc mom to wait for wide open mouth, pull infant on for deep latch, and to flange lips as needed. BF Basics reviewed including cluster feeding, supply and demand, I/O,  and stomach size. Discussed NB feeding behavior and encouraged to feed infant 8-12 x in 24 hours. [facility] Brochure given with information on Support Groups, OP Services, Phone # and BF Resources. Enc mom to call with questions/concerns.  Patient Name: Margaret George Date: 08/01/2015 Reason for consult: Initial assessment   Maternal Data Formula Feeding for Exclusion: No Does the patient have breastfeeding experience prior to this delivery?: Yes  Feeding Feeding Type: Breast Fed Length of feed: 15 min  LATCH Score/Interventions Latch: Grasps breast easily, tongue down, lips flanged, rhythmical sucking.  Audible Swallowing: A few with stimulation Intervention(s): Skin to skin;Hand expression;Alternate breast massage  Type of Nipple: Everted at rest and after stimulation  Comfort (Breast/Nipple): Soft / non-tender     Hold (Positioning): Assistance needed to correctly position infant at breast and maintain latch. Intervention(s): Breastfeeding basics reviewed;Support Pillows;Position options;Skin to skin  LATCH Score: 8  Lactation Tools Discussed/Used     Consult Status Consult Status: Follow-up Date: 08/02/15 Follow-up type: In-patient    Debby Freiberg  Hice 08/01/2015, 3:14 PM

## 2015-08-01 NOTE — Progress Notes (Signed)
Patient ID: Margaret George, female   DOB: 1976/03/07, 39 y.o.   MRN: [id number] DOS afebrile VS normal output clear yellow urine No complaints

## 2015-08-01 NOTE — Anesthesia Postprocedure Evaluation (Signed)
  Anesthesia Post-op Note  Patient: Margaret George  Procedure(s) Performed: Procedure(s): CESAREAN SECTION (N/A)  Patient is awake, responsive, moving her legs, and has signs of resolution of her numbness. Pain and nausea are reasonably well controlled. Vital signs are stable and clinically acceptable. Oxygen saturation is clinically acceptable. There are no apparent anesthetic complications at this time. Patient is ready for discharge.

## 2015-08-01 NOTE — Anesthesia Postprocedure Evaluation (Signed)
Anesthesia Post Note  Patient: Margaret George  Procedure(s) Performed: Procedure(s) (LRB): CESAREAN SECTION (N/A)  Anesthesia type: Spinal  Patient location: Mother/Baby  Post pain: Pain level controlled  Post assessment: Post-op Vital signs reviewed  Last Vitals:  Filed Vitals:   08/01/15 1147  BP: 104/59  Pulse: 68  Temp: 36.9 C  Resp: 18    Post vital signs: Reviewed  Level of consciousness: awake  Complications: No apparent anesthesia complications

## 2015-08-01 NOTE — Op Note (Signed)
[name], Margaret George NO.:  [id number]  MEDICAL RECORD NO.:  [id number]  LOCATION:  [facility]                          FACILITY:  [facility]  PHYSICIAN:  Thornell Sartorius, MD        DATE OF BIRTH:  [birth year]  DATE OF PROCEDURE:  08/01/2015 DATE OF DISCHARGE:                              OPERATIVE REPORT   PREOPERATIVE DIAGNOSIS:  Intrauterine pregnancy at 39+ weeks, history of low-transverse cesarean section x2.  POSTOPERATIVE DIAGNOSIS:  Intrauterine pregnancy at 39+ weeks, history of low-transverse cesarean section x2, delivered.  PROCEDURE:  Repeat low transverse cesarean section.  SURGEON:  Thornell Sartorius, MD.  ASSISTANT:  None.  ANESTHESIA:  Spinal.  ESTIMATED BLOOD LOSS:  Approximately 800 mL.  IV FLUIDS:  3200 mL.  URINE OUTPUT:  200 mL clear urine at the end of the procedure.  FINDINGS:  Viable female infant at 07:54 a.m. with Apgars of 10 at 1 minute and 10 at 5 minutes, and a weight of 9 pounds 4.5 ounces.  Normal uterus, tubes, and ovaries are noted.  COMPLICATIONS:  None.  PATHOLOGY:  Placenta to Labor and Delivery.  PROCEDURE IN DETAIL:  After informed consent was reviewed with the patient and her husband, she was transported to the operating room, where a spinal anesthesia was placed and found to be adequate.  She was then prepped and draped in the normal sterile fashion.  A Foley catheter was sterilely placed.  A Pfannenstiel skin incision was made at the level of her previous incision, carried through to the underlying layer of fascia sharply.  The fascia was incised in the midline.  The midline incision was extended laterally with Mayo scissors.  The superior aspect of the fascial incision was grasped with clamps, and the elevating the rectus muscles were dissected off both bluntly and sharply.  The midline was easily identified.  Peritoneum was entered sharply.  Peritoneal incision was extended superiorly and inferiorly with good visualization of  the bladder.  Some uteroperitoneal vesicle adhesions were noted. These were taken down by sharp dissection.  The uterus was incised in transverse fashion, and the infant was delivered from a vertex presentation.  Nose and mouth were suctioned on the field.  Cord was clamped and cut.  After delaying cord clamping for a minute, the infant was then handed off to awaiting pediatric staff.  The placenta were expressed from the uterus.  The uterus was cleared of all clot and debris.  The uterine incision was closed in two layers of 0 Monocryl, the first of which was a running locked and the second was an imbricating layer.  The incision was noted to be hemostatic.  The gutters were cleared of all clot and debris.  The peritoneum was closed with 2-0 Vicryl.  The Alexis retractor had been removed.  The fascia was reapproximated with 0 Vicryl.  The subcuticular adipose layer was made hemostatic with Bovie cautery, and the dead space was closed with 3-0 plain gut.  The skin was closed with 4-0 Vicryl in a subcuticular fashion with a Lanny Hurst needle.  Benzoin and Steri-Strips were applied. Sponge, lap, and needle counts were correct x2 per the operating staff.  Thornell Sartorius, MD     JB/MEDQ  D:  08/01/2015  T:  08/01/2015  Job:  [id number]

## 2015-08-01 NOTE — Transfer of Care (Signed)
Immediate Anesthesia Transfer of Care Note  Patient: Margaret George  Procedure(s) Performed: Procedure(s): CESAREAN SECTION (N/A)  Patient Location: PACU  Anesthesia Type:Spinal  Level of Consciousness: awake, alert  and oriented  Airway & Oxygen Therapy: Patient Spontanous Breathing  Post-op Assessment: Report given to RN and Post -op Vital signs reviewed and stable  Post vital signs: Reviewed and stable  Last Vitals:  Filed Vitals:   08/01/15 0614  BP: 105/72  Pulse: 87  Temp: 36.4 C  Resp: 18    Complications: No apparent anesthesia complications

## 2015-08-01 NOTE — Anesthesia Preprocedure Evaluation (Signed)
Anesthesia Evaluation  Patient identified by MRN, date of birth, ID band Patient awake    Reviewed: Allergy & Precautions, H&P , Patient's Chart, lab work & pertinent test results  Airway Mallampati: II  TM Distance: >3 FB Neck ROM: full    Dental no notable dental hx.    Pulmonary    Pulmonary exam normal breath sounds clear to auscultation       Cardiovascular Exercise Tolerance: Good  Rhythm:regular Rate:Normal     Neuro/Psych    GI/Hepatic   Endo/Other    Renal/GU      Musculoskeletal   Abdominal   Peds  Hematology   Anesthesia Other Findings Low Plts  Reproductive/Obstetrics                             Anesthesia Physical Anesthesia Plan  ASA: II  Anesthesia Plan: Spinal   Post-op Pain Management:    Induction:   Airway Management Planned:   Additional Equipment:   Intra-op Plan:   Post-operative Plan:   Informed Consent: I have reviewed the patients History and Physical, chart, labs and discussed the procedure including the risks, benefits and alternatives for the proposed anesthesia with the patient or authorized representative who has indicated his/her understanding and acceptance.   Dental Advisory Given  Plan Discussed with: CRNA  Anesthesia Plan Comments: (Lab work confirmed with CRNA in room. Platelets okay. Discussed spinal anesthetic, and patient consents to the procedure:  included risk of possible headache,backache, failed block, allergic reaction, and nerve injury. This patient was asked if she had any questions or concerns before the procedure started. )        Anesthesia Quick Evaluation

## 2015-08-01 NOTE — Anesthesia Procedure Notes (Signed)
Spinal Patient location during procedure: OR Preanesthetic Checklist Completed: patient identified, site marked, surgical consent, pre-op evaluation, timeout performed, IV checked, risks and benefits discussed and monitors and equipment checked Spinal Block Patient position: sitting Prep: DuraPrep Patient monitoring: heart rate, cardiac monitor, continuous pulse ox and blood pressure Approach: midline Location: L3-4 Injection technique: single-shot Needle Needle type: Sprotte  Needle gauge: 24 G Needle length: 9 cm Assessment Sensory level: T4 Additional Notes Spinal Dosage in OR  Bupivicaine ml       1.6 PFMS04   mcg        100 Fentanyl mcg            25

## 2015-08-01 NOTE — Brief Op Note (Signed)
08/01/2015  8:37 AM  PATIENT:  Margaret George  39 y.o. female  PRE-OPERATIVE DIAGNOSIS:  Repeat C/Section--routine low transverse cesarean section  POST-OPERATIVE DIAGNOSIS:  Repeat C/Section--routine low transverse cesarean section  PROCEDURE:  Procedure(s): CESAREAN SECTION (N/A)  SURGEON:  Surgeon(s) and Role:    * Cambri Plourde Bovard-Stuckert, MD - Primary  ANESTHESIA:   spinal  EBL:  Total I/O In: 3200 [I.V.:3200] Out: 1000 [Urine:200; Blood:800]  FINDINGS: viable female infant at 7:54, apgars 10/10, wt 9#4.5, nl uterus, tubes and ovaries.    BLOOD ADMINISTERED:none  DRAINS: Urinary Catheter (Foley)   LOCAL MEDICATIONS USED:  NONE  SPECIMEN:  Source of Specimen:  Placenta  DISPOSITION OF SPECIMEN:  L&D  COUNTS:  YES  TOURNIQUET:  * No tourniquets in log *  DICTATION: .Other Dictation: Dictation Number [id number]  PLAN OF CARE: Admit to inpatient   PATIENT DISPOSITION:  PACU - hemodynamically stable.   Delay start of Pharmacological VTE agent (>24hrs) due to surgical blood loss or risk of bleeding: not applicable

## 2015-08-01 NOTE — Addendum Note (Signed)
Addendum  created 08/01/15 1324 by Asher Muir, CRNA   Modules edited: Notes Section   Notes Section:  File: [id number]

## 2015-08-02 ENCOUNTER — Encounter (HOSPITAL_COMMUNITY): Payer: Self-pay | Admitting: Obstetrics and Gynecology

## 2015-08-02 LAB — CBC
HEMATOCRIT: 30.4 % — AB (ref 36.0–46.0)
Hemoglobin: 10.2 g/dL — ABNORMAL LOW (ref 12.0–15.0)
MCH: 30.1 pg (ref 26.0–34.0)
MCHC: 33.6 g/dL (ref 30.0–36.0)
MCV: 89.7 fL (ref 78.0–100.0)
PLATELETS: 101 10*3/uL — AB (ref 150–400)
RBC: 3.39 MIL/uL — ABNORMAL LOW (ref 3.87–5.11)
RDW: 14.3 % (ref 11.5–15.5)
WBC: 9.7 10*3/uL (ref 4.0–10.5)

## 2015-08-02 LAB — BIRTH TISSUE RECOVERY COLLECTION (PLACENTA DONATION)

## 2015-08-02 NOTE — Progress Notes (Signed)
Notified Dr Willis Modena patient complains of right calf cramping, homans negative.

## 2015-08-02 NOTE — Progress Notes (Signed)
Subjective: Postpartum Day 1: Cesarean Delivery Patient reports incisional pain and tolerating PO.  Pain controlled, nl lochia.    Objective: Vital signs in last 24 hours: Temp:  [97.5 F (36.4 C)-98.9 F (37.2 C)] 98.1 F (36.7 C) (10/04 0345) Pulse Rate:  [54-110] 66 (10/04 0345) Resp:  [14-36] 18 (10/04 0345) BP: (88-114)/(49-76) 102/57 mmHg (10/04 0345) SpO2:  [95 %-99 %] 96 % (10/03 2345)  Physical Exam:  General: alert and no distress Lochia: appropriate Uterine Fundus: firm Incision: healing well DVT Evaluation: No evidence of DVT seen on physical exam.   Recent Labs  08/01/15 0610 08/02/15 0550  HGB 12.2 10.2*  HCT 36.6 30.4*    Assessment/Plan: Status post Cesarean section. Doing well postoperatively.  Continue current care.  Bovard-Stuckert, Kolbe Delmonaco 08/02/2015, 6:30 AM

## 2015-08-02 NOTE — Progress Notes (Signed)
Patient complains of cramping in right upper calf, homen's sign negative.

## 2015-08-03 MED ORDER — IBUPROFEN 800 MG PO TABS: 800.0000 mg | ORAL_TABLET | Freq: Three times a day (TID) | ORAL | Status: DC

## 2015-08-03 MED ORDER — OXYCODONE-ACETAMINOPHEN 5-325 MG PO TABS: 1.0000 | ORAL_TABLET | Freq: Four times a day (QID) | ORAL | Status: DC | PRN

## 2015-08-03 MED ORDER — PRENATAL PLUS 27-1 MG PO TABS: 1.0000 | ORAL_TABLET | Freq: Every day | ORAL | Status: DC

## 2015-08-03 NOTE — Discharge Summary (Signed)
Obstetric Discharge Summary Reason for Admission: cesarean section Prenatal Procedures: none Intrapartum Procedures: cesarean: low cervical, transverse Postpartum Procedures: none Complications-Operative and Postpartum: none HEMOGLOBIN  Date Value Ref Range Status  08/02/2015 10.2* 12.0 - 15.0 g/dL Final   HCT  Date Value Ref Range Status  08/02/2015 30.4* 36.0 - 46.0 % Final    Physical Exam:  General: alert and no distress Lochia: appropriate Uterine Fundus: firm Incision: healing well DVT Evaluation: No evidence of DVT seen on physical exam.  Discharge Diagnoses: Term Pregnancy-delivered  Discharge Information: Date: 08/03/2015 Activity: pelvic rest Diet: routine Medications: PNV, Ibuprofen and Percocet Condition: stable Instructions: refer to practice specific booklet Discharge to: home Follow-up Information    Follow up with Bovard-Stuckert, Jeral Fruit, MD In 2 weeks.   Specialty:  Obstetrics and Gynecology   Why:  Incision check, 6 wks for postpartum check   Contact information:   [address] [address] [phone]       Newborn Data: Live born female  Birth Weight: 9 lb 4.5 oz (4210 g) APGAR: 10, 10  Home with mother.  Bovard-Stuckert, Celisa Schoenberg 08/03/2015, 10:47 AM

## 2015-08-03 NOTE — Lactation Note (Signed)
This note was copied from the chart of Girl Emalene Welte. Lactation Consultation Note  Follow up with mom and Aiden prior to D/C. Baby has had 17 BF for 15-20 minutes, 4 voids and 4 stools in last 24 hours. Stools are now green. Weight loss 7%. Mom c/o Sore nipples and had difficulty with 69.37 year old and excoriated nipples after D/C.  Right nipple noted to have a bruised area and a reddened area to center of nipple. Mom latched infant in cross cradle hold independently. Infant note to not open wide and to close mouth fast, encouraged mom to wait for wide open mouth and to latch quickly. Baby needed assistance with flanging of lips, showed mom how to check and correct. Infant needed to be relatched 3-4 times to obtain deeper latch. Mom demonstrated breaking suction prior to removing from breast. Mom did report that the pain was lessened. Nipple was pinched on left side when infant taken off, was much rounder on right side when taken off. Infant noted to be sucking tongue between latches, she does extend tongue over gum line when sucking on gloved finger. Demonstrated suck training to mom and dad and enc them to do prior to each feeding. Enc mom to feed 8-12 x in 24 hours at first feeding cues. Maintain I/O record and take to Ped visit. Discussed nipple care using gels, shells, olive oil and coconut oil. Enc mom to call if nipple soreness does not improve or worsens to schedule OP appointment for feeding assessment. Discussed Engorgement prevention discussed with prepumping PRN to latch and post feed comfort pumping. Mom is able to hand express, sent home with hand pump and to order DEBP through [employer]. To schedule Ped appt for follow up on Friday. Reiterated [place] brochure information of OP services, Phone #, Support Groups, and BF Resource sheet. Enc mom to call PRN questions/concerns.  Patient Name: Girl Dhara Schepp YDXAJ'O Date: 08/03/2015 Reason for consult: Follow-up  assessment   Maternal Data Does the patient have breastfeeding experience prior to this delivery?: Yes  Feeding Feeding Type: Breast Fed Length of feed: 20 min  LATCH Score/Interventions Latch: Repeated attempts needed to sustain latch, nipple held in mouth throughout feeding, stimulation needed to elicit sucking reflex. Intervention(s): Adjust position;Assist with latch;Breast massage;Breast compression  Audible Swallowing: Spontaneous and intermittent Intervention(s): Skin to skin;Hand expression  Type of Nipple: Everted at rest and after stimulation  Comfort (Breast/Nipple): Filling, red/small blisters or bruises, mild/mod discomfort  Problem noted: Cracked, bleeding, blisters, bruises;Mild/Moderate discomfort Interventions  (Cracked/bleeding/bruising/blister): Expressed breast milk to nipple;Hand pump Interventions (Mild/moderate discomfort): Hand expression;Comfort gels  Hold (Positioning): No assistance needed to correctly position infant at breast. Intervention(s): Breastfeeding basics reviewed;Support Pillows;Position options;Skin to skin  LATCH Score: 8  Lactation Tools Discussed/Used Tools: Shells;Comfort gels Shell Type: Sore;Inverted Pump Review: Setup, frequency, and cleaning;Milk Storage   Consult Status      Donn Pierini 08/03/2015, 9:36 AM

## 2015-08-03 NOTE — Progress Notes (Signed)
Subjective: Postpartum Day 2: Cesarean Delivery Patient reports incisional pain and tolerating PO.  Nl lochia, pain controlled    Objective: Vital signs in last 24 hours: Temp:  [98 F (36.7 C)-98.1 F (36.7 C)] 98.1 F (36.7 C) (10/04 1829) Pulse Rate:  [58-68] 58 (10/05 0546) Resp:  [18] 18 (10/05 0546) BP: (97-100)/(51-65) 98/60 mmHg (10/05 0546) SpO2:  [99 %] 99 % (10/04 0755)  Physical Exam:  General: alert and no distress Lochia: appropriate Uterine Fundus: firm Incision: healing well DVT Evaluation: No evidence of DVT seen on physical exam.   Recent Labs  08/01/15 0610 08/02/15 0550  HGB 12.2 10.2*  HCT 36.6 30.4*    Assessment/Plan: Status post Cesarean section. Doing well postoperatively.  Continue current care. Desires d/c to home - D/C with Motrin, percocet and PNV.  F/u 2 wk and 6 wk Margaret George, Margaret George 08/03/2015, 6:30 AM

## 2015-08-06 NOTE — Progress Notes (Signed)
[place]  Telephone:(336) [phone] Fax:(336) [phone]  ID: Margaret George OB: 06/30/1976  MR#: [id number]  TXM#:[id number]  Patient Care Team: Janyth Contes, MD as PCP - General (Obstetrics and Gynecology)  CHIEF COMPLAINT:  Chief Complaint  Patient presents with  . Follow-up    thrombocytopenia during pregnancy    INTERVAL HISTORY: Patient returns to clinic today for repeat laboratory work and further evaluation. She continues to feel well and is asymptomatic. She denies any easy bleeding or bruising. She has no neurologic complaints. She denies any recent fevers or illnesses. She is gaining weight appropriately. She denies any chest pain or shortness of breath. She denies any nausea, vomiting, constipation, or diarrhea. She has no urinary complaints. Patient offers no specific complaints today.  REVIEW OF SYSTEMS:   Review of Systems  Constitutional: Negative.   Respiratory: Negative.   Cardiovascular: Negative.   Endo/Heme/Allergies: Does not bruise/bleed easily.    As per HPI. Otherwise, a complete review of systems is negatve.  PAST MEDICAL HISTORY: Past Medical History  Diagnosis Date  . 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 Normal pregnancy 10/11/2011  . Large for gestational age fetus 10/11/2011  . Large for gestational age (LGA) 10/12/2011  . S/P cesarean section 12/31/2013  . History of chicken pox   . History of UTI   . S/P cesarean section 08/01/2015    PAST SURGICAL HISTORY: Past Surgical History  Procedure Laterality Date  . Breast lumpectomy    . Biopsy breast    . Tooth extraction  1995    wisdom teeth taken out  . Cesarean section  10/13/2011    Procedure: CESAREAN SECTION;  Surgeon: Thornell Sartorius, MD;  Location: [facility];  Service: Gynecology;  Laterality: N/A;  Primary cesarean section with delivery of baby girl at 12. Apgars 8/9.  Marland Kitchen Cesarean section N/A 12/31/2013    Procedure: REPEAT CESAREAN SECTION;  Surgeon: Thornell Sartorius, MD;  Location: [facility];  Service: Obstetrics;  Laterality: N/A;  . Wisdom tooth extraction    . Cesarean section N/A 08/01/2015    Procedure: CESAREAN SECTION;  Surgeon: Janyth Contes, MD;  Location: [facility];  Service: Obstetrics;  Laterality: N/A;    FAMILY HISTORY Family History  Problem Relation Age of Onset  . Osteoporosis Mother   . Diverticulitis Other     3 grandparents   . Arthritis/Rheumatoid Mother   . Arthritis Maternal Grandmother   . Hyperlipidemia Father   . Hypertension Mother   . Hypertension Father   . Sudden death Maternal Uncle     ?heart attack vs Stroke  . Diabetes Father     pre diabetic  . Diabetes Paternal Grandmother        ADVANCED DIRECTIVES:    HEALTH MAINTENANCE: Social History  Substance Use Topics  . Smoking status: Never Smoker   . Smokeless tobacco: Never Used  . Alcohol Use: No     Colonoscopy:  PAP:  Bone density:  Lipid panel:  Allergies  Allergen Reactions  . Codeine Itching  . Morphine And Related Itching    Current Outpatient Prescriptions  Medication Sig Dispense Refill  . calcium carbonate (TUMS - DOSED IN MG ELEMENTAL CALCIUM) 500 MG chewable tablet Chew 1 tablet by mouth daily.    Marland Kitchen ibuprofen (ADVIL,MOTRIN) 800 MG tablet Take 1 tablet (800 mg total) by mouth every 8 (eight) hours. 45 tablet 1  . oxyCODONE-acetaminophen (PERCOCET/ROXICET) 5-325 MG tablet Take 1-2 tablets by mouth every 6 (  six) hours as needed. 30 tablet 0  . prenatal vitamin w/FE, FA (PRENATAL 1 + 1) 27-1 MG TABS tablet Take 1 tablet by mouth daily. 30 each 12   No current facility-administered medications for this visit.    OBJECTIVE: Filed Vitals:   07/25/15 1606  BP: 101/67  Pulse: 80  Temp: 97.2 F (36.2 C)  Resp: 16     Body mass index is 34.6 kg/(m^2).    ECOG FS:0 - Asymptomatic  General: Well-developed, well-nourished, no acute distress. Eyes: Pink conjunctiva, anicteric sclera. Lungs: Clear to auscultation  bilaterally. Heart: Regular rate and rhythm. No rubs, murmurs, or gallops. Abdomen: Appears appropriate for gestational age. Musculoskeletal: No edema, cyanosis, or clubbing. Neuro: Alert, answering all questions appropriately. Cranial nerves grossly intact. Skin: No rashes or petechiae noted. Psych: Normal affect.  LAB RESULTS:  No results found for: NA, K, CL, CO2, GLUCOSE, BUN, CREATININE, CALCIUM, PROT, ALBUMIN, AST, ALT, ALKPHOS, BILITOT, GFRNONAA, GFRAA  Lab Results  Component Value Date   WBC 9.7 08/02/2015   NEUTROABS 7.6* 07/25/2015   HGB 10.2* 08/02/2015   HCT 30.4* 08/02/2015   MCV 89.7 08/02/2015   PLT 101* 08/02/2015     STUDIES: No results found.  ASSESSMENT: Thrombocytopenia in pregnancy.  PLAN:    1. Thrombocytopenia: Patient's platelet count is stable at 104. No intervention is needed at this time. Patient was found to have a positive platelet antibody, but these can be transient in nature. The remainder of her laboratory work was either negative or within normal limits. She is being induced on August 01, 2015. No follow up has been scheduled.  Please check a platelet count about 3 months post partum.  If it does not return to within normal limits, please refer her back for further evaluation.    Patient expressed understanding and was in agreement with this plan. She also understands that She can call clinic at any time with any questions, concerns, or complaints.    Lloyd Huger, MD   08/06/2015 1:27 PM

## 2015-10-04 ENCOUNTER — Telehealth: Payer: Self-pay | Admitting: Family Medicine

## 2015-10-04 NOTE — Telephone Encounter (Signed)
Pt has appt 10/05/15 at 10:15 with Dr Glori Bickers.

## 2015-10-04 NOTE — Telephone Encounter (Signed)
[place]  Patient Name: Margaret George  DOB: [birth year]    Initial Comment Caller states she has some blood in her stool.    Nurse Assessment  Nurse: Luther Parody, RN, Malachy Mood Date/Time (Eastern Time): 10/04/2015 2:30:20 PM  Confirm and document reason for call. If symptomatic, describe symptoms. ---Caller states that she has been seeing bright red blood on the toilet tissue with wiping for the last 2 wks. Denies any blood in the stool or the toilet bowl.  Has the patient traveled out of the country within the last 30 days? ---Not Applicable  Does the patient have any new or worsening symptoms? ---Yes  Will a triage be completed? ---Yes  Related visit to physician within the last 2 weeks? ---No  Does the PT have any chronic conditions? (i.e. diabetes, asthma, etc.) ---No  Did the patient indicate they were pregnant? ---No  Is this a behavioral health or substance abuse call? ---No     Guidelines    Guideline Title Affirmed Question Affirmed Notes  Rectal Bleeding MILD rectal bleeding (more than just a few drops or streaks)    Final Disposition User   See PCP When Office is Open (within 3 days) Luther Parody, RN, Malachy Mood    Disagree/Comply: Comply

## 2015-10-04 NOTE — Telephone Encounter (Signed)
I will see her then  

## 2015-10-05 ENCOUNTER — Encounter: Payer: Self-pay | Admitting: Family Medicine

## 2015-10-05 ENCOUNTER — Ambulatory Visit (INDEPENDENT_AMBULATORY_CARE_PROVIDER_SITE_OTHER): Payer: BC Managed Care – PPO | Admitting: Family Medicine

## 2015-10-05 VITALS — BP 110/82 | HR 86 | Temp 97.6°F | Ht 66.0 in | Wt 199.2 lb

## 2015-10-05 DIAGNOSIS — E669 Obesity, unspecified: Secondary | ICD-10-CM

## 2015-10-05 DIAGNOSIS — K648 Other hemorrhoids: Secondary | ICD-10-CM | POA: Diagnosis not present

## 2015-10-05 MED ORDER — HYDROCORTISONE ACETATE 25 MG RE SUPP: 25.0000 mg | Freq: Every day | RECTAL | Status: DC

## 2015-10-05 NOTE — Progress Notes (Signed)
Subjective:    Patient ID: Margaret George, female    DOB: [birth year], 39 y.o.   MRN: [id number]  HPI Here with rectal bleeding  Thinks it is hemorrhoids   (used anusol suppos in the past)  Seeing BRB when she wipes  Not a lot  Going - on and off since baby was born  Not constipation - not straining a whole lot  Does not feel anything externally at all  No itching or burning     No abd pain or rectal pain   Has fam hx of diverticulitis -no colon cancer   Just had a baby in Oct   Taking prenatal vitamin -womens one a day  Lab Results  Component Value Date   WBC 9.7 08/02/2015   HGB 10.2* 08/02/2015   HCT 30.4* 08/02/2015   MCV 89.7 08/02/2015   PLT 101* 08/02/2015    Has been tired Her platelet count went back up after pregnancy   Patient Active Problem List   Diagnosis Date Noted  . S/P cesarean section 08/01/2015  . AOM (acute otitis media) 12/19/2014  . Pregnant 12/01/2014  . Acute blood loss anemia 05/21/2014  . Knee pain, bilateral 05/21/2014  . Rectal bleeding 05/21/2014   Past Medical History  Diagnosis Date  . 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 Normal pregnancy 10/11/2011  . Large for gestational age fetus 10/11/2011  . Large for gestational age (LGA) 10/12/2011  . S/P cesarean section 12/31/2013  . History of chicken pox   . History of UTI   . S/P cesarean section 08/01/2015   Past Surgical History  Procedure Laterality Date  . Breast lumpectomy    . Biopsy breast    . Tooth extraction  1995    wisdom teeth taken out  . Cesarean section  10/13/2011    Procedure: CESAREAN SECTION;  Surgeon: Thornell Sartorius, MD;  Location: [facility];  Service: Gynecology;  Laterality: N/A;  Primary cesarean section with delivery of baby girl at 27. Apgars 8/9.  Marland Kitchen Cesarean section N/A 12/31/2013    Procedure: REPEAT CESAREAN SECTION;  Surgeon: Thornell Sartorius, MD;  Location: [facility];  Service: Obstetrics;  Laterality: N/A;  . Wisdom tooth extraction    . Cesarean  section N/A 08/01/2015    Procedure: CESAREAN SECTION;  Surgeon: Janyth Contes, MD;  Location: [facility];  Service: Obstetrics;  Laterality: N/A;   Social History  Substance Use Topics  . Smoking status: Never Smoker   . Smokeless tobacco: Never Used  . Alcohol Use: No   Family History  Problem Relation Age of Onset  . Osteoporosis Mother   . Diverticulitis Other     3 grandparents   . Arthritis/Rheumatoid Mother   . Arthritis Maternal Grandmother   . Hyperlipidemia Father   . Hypertension Mother   . Hypertension Father   . Sudden death Maternal Uncle     ?heart attack vs Stroke  . Diabetes Father     pre diabetic  . Diabetes Paternal Grandmother    Allergies  Allergen Reactions  . Codeine Itching  . Morphine And Related Itching   Current Outpatient Prescriptions on File Prior to Visit  Medication Sig Dispense Refill  . prenatal vitamin w/FE, FA (PRENATAL 1 + 1) 27-1 MG TABS tablet Take 1 tablet by mouth daily. 30 each 12   No current facility-administered medications on file prior to visit.     Review of Systems Review of Systems  Constitutional: Negative  for fever, appetite change,  and unexpected weight change.  Eyes: Negative for pain and visual disturbance.  Respiratory: Negative for cough and shortness of breath.   Cardiovascular: Negative for cp or palpitations    Gastrointestinal: Negative for nausea, diarrhea and constipation. pos for occ BRB per rectum  Genitourinary: Negative for urgency and frequency. neg for dysuria  Skin: Negative for pallor or rash   Neurological: Negative for weakness, light-headedness, numbness and headaches.  Hematological: Negative for adenopathy. Does not bruise/bleed easily.  Psychiatric/Behavioral: Negative for dysphoric mood. The patient is not nervous/anxious.         Objective:   Physical Exam  Constitutional: She appears well-developed and well-nourished. No distress.  obese and well appearing   Eyes: Conjunctivae  and EOM are normal. Pupils are equal, round, and reactive to light.  Neck: Normal range of motion. Neck supple.  Cardiovascular: Normal rate, regular rhythm and normal heart sounds.   Pulmonary/Chest: Effort normal and breath sounds normal.  Abdominal: Soft. Bowel sounds are normal. She exhibits no distension and no mass. There is no tenderness. There is no rebound and no guarding.  Genitourinary: Rectal exam shows internal hemorrhoid. Rectal exam shows no external hemorrhoid, no fissure, no mass, no tenderness and anal tone normal. Guaiac negative stool.  Several internal hemorrhoids (non thrombosed) at 6:00 and 9:00 positions  Not actively bleeding today  No fissures or tenderness   Lymphadenopathy:    She has no cervical adenopathy.  Neurological: She is alert.  Skin: Skin is warm and dry. No pallor.  Psychiatric: She has a normal mood and affect.          Assessment & Plan:   Problem List Items Addressed This Visit      Cardiovascular and Mediastinum   Internal hemorrhoids    Recurrent internal hemorrhoids with mild bleeding after 2nd pregancy Denies constipation or straining  Px anusol hc suppositories that worked well in the past  Disc avoidance of straining/keeping stools soft  Update if not starting to improve in a week or if worsening          Other   Obesity - Primary    Discussed how this problem influences overall health and the risks it imposes  Reviewed plan for weight loss with lower calorie diet (via better food choices and also portion control or program like weight watchers) and exercise building up to or more than 30 minutes 5 days per week including some aerobic activity

## 2015-10-05 NOTE — Progress Notes (Signed)
Pre visit review using our clinic review tool, if applicable. No additional management support is needed unless otherwise documented below in the visit note. 

## 2015-10-05 NOTE — Patient Instructions (Signed)
Drink lots of fluids and try not to get constipated  Also eat lots of fiber  Use anusol hc suppository for hemorrhoids as directed Update if not starting to improve in a week or if worsening    You need time for you - exercise and diet/- to loose the weight  Sneak in exercise whenever you can  I like the app - called MYFITNESSPAL- it helps you track calories and that can help with weight loss

## 2015-10-06 NOTE — Assessment & Plan Note (Signed)
Recurrent internal hemorrhoids with mild bleeding after 2nd pregancy Denies constipation or straining  Px anusol hc suppositories that worked well in the past  Disc avoidance of straining/keeping stools soft  Update if not starting to improve in a week or if worsening

## 2015-10-06 NOTE — Assessment & Plan Note (Addendum)
Discussed how this problem influences overall health and the risks it imposes  Reviewed plan for weight loss with lower calorie diet (via better food choices and also portion control or program like weight watchers) and exercise building up to or more than 30 minutes 5 days per week including some aerobic activity    

## 2016-12-04 ENCOUNTER — Ambulatory Visit (HOSPITAL_COMMUNITY)
Admission: EM | Admit: 2016-12-04 | Discharge: 2016-12-04 | Disposition: A | Discharge status: Home / Self Care | Payer: BC Managed Care – PPO | Attending: Family Medicine | Admitting: Family Medicine

## 2016-12-04 DIAGNOSIS — J020 Streptococcal pharyngitis: Secondary | ICD-10-CM

## 2016-12-04 LAB — POCT RAPID STREP A: STREPTOCOCCUS, GROUP A SCREEN (DIRECT): POSITIVE — AB

## 2016-12-04 MED ORDER — AMOXICILLIN-POT CLAVULANATE 875-125 MG PO TABS: 1.0000 | ORAL_TABLET | Freq: Two times a day (BID) | ORAL | 0 refills | Status: DC

## 2016-12-04 NOTE — ED Triage Notes (Signed)
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

## 2016-12-04 NOTE — Discharge Instructions (Signed)
You are positive for strep throat. I have started you on an antibiotic called Augmentin. Take 1 tablet twice a day for ten days. You may take Tylenol or ibuprofen every 4-6 hours as needed for fever and sore throat, or you may use chloraseptic lozenges or throat spray. I would also recommend while you are at the pharmacy to pick up a product called a cerumenlytic. Any brand will do and the pharmacist can help you choose one. Use the product as directed in your left ear. Should your symptoms not improve, follow up with your primary care provider or return to clinic as needed.

## 2016-12-04 NOTE — ED Provider Notes (Signed)
CSN: [id number]     Arrival date & time 12/04/16  1102 History   None    Chief Complaint  Patient presents with  . Sore Throat   (Consider location/radiation/quality/duration/timing/severity/associated sxs/prior Treatment) 41 year old female presents to clinic with two day history of sore throat, denies fever, chills or cough, no change in voice, She does have pain with swollowing    The history is provided by the patient.  Sore Throat     Past Medical History:  Diagnosis Date  . 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 History of chicken pox   . History of UTI   . Large for gestational age (LGA) 10/12/2011  . Large for gestational age fetus 10/11/2011  . Normal pregnancy 10/11/2011  . S/P cesarean section 12/31/2013  . S/P cesarean section 08/01/2015   Past Surgical History:  Procedure Laterality Date  . BIOPSY BREAST    . BREAST LUMPECTOMY    . CESAREAN SECTION  10/13/2011   Procedure: CESAREAN SECTION;  Surgeon: Thornell Sartorius, MD;  Location: [facility];  Service: Gynecology;  Laterality: N/A;  Primary cesarean section with delivery of baby girl at 72. Apgars 8/9.  Marland Kitchen CESAREAN SECTION N/A 12/31/2013   Procedure: REPEAT CESAREAN SECTION;  Surgeon: Thornell Sartorius, MD;  Location: [facility];  Service: Obstetrics;  Laterality: N/A;  . CESAREAN SECTION N/A 08/01/2015   Procedure: CESAREAN SECTION;  Surgeon: Janyth Contes, MD;  Location: [facility];  Service: Obstetrics;  Laterality: N/A;  . TOOTH EXTRACTION  1995   wisdom teeth taken out  . WISDOM TOOTH EXTRACTION     Family History  Problem Relation Age of Onset  . Osteoporosis Mother   . Diverticulitis Other     3 grandparents   . Arthritis/Rheumatoid Mother   . Arthritis Maternal Grandmother   . Hyperlipidemia Father   . Hypertension Mother   . Hypertension Father   . Sudden death Maternal Uncle     ?heart attack vs Stroke  . Diabetes Father     pre diabetic  . Diabetes Paternal Grandmother    Social History  Substance Use  Topics  . Smoking status: Never Smoker  . Smokeless tobacco: Never Used  . Alcohol use No   OB History    Gravida Para Term Preterm AB Living   3 3 3     3    SAB TAB Ectopic Multiple Live Births         0 3     Review of Systems  Reason unable to perform ROS: as covered in HPI.  All other systems reviewed and are negative.   Allergies  Codeine and Morphine and related  Home Medications   Prior to Admission medications   Medication Sig Start Date End Date Taking? Authorizing Provider  amoxicillin-clavulanate (AUGMENTIN) 875-125 MG tablet Take 1 tablet by mouth 2 (two) times daily. 12/04/16   Barnet Glasgow, NP  hydrocortisone (ANUSOL-HC) 25 MG suppository Place 1 suppository (25 mg total) rectally at bedtime. 10/05/15   Abner Greenspan, MD  prenatal vitamin w/FE, FA (PRENATAL 1 + 1) 27-1 MG TABS tablet Take 1 tablet by mouth daily. 08/03/15   Janyth Contes, MD   Meds Ordered and Administered this Visit  Medications - No data to display  Pulse 89   Temp 98.6 F (37 C) (Oral)   Resp 16   SpO2 100%  No data found.   Physical Exam  Constitutional: She is oriented to person, place, and time. She  appears well-developed and well-nourished. No distress.  HENT:  Head: Normocephalic.  Right Ear: Tympanic membrane and external ear normal.  Left Ear: External ear normal.  Nose: Nose normal.  Mouth/Throat: Uvula is midline. Oropharyngeal exudate and posterior oropharyngeal erythema present. No tonsillar abscesses. Tonsils are 2+ on the right. Tonsils are 2+ on the left. Tonsillar exudate.  Wax build up in left ear partially obscuring the TM  Eyes: Pupils are equal, round, and reactive to light.  Neck: Normal range of motion. Neck supple.  Cardiovascular: Normal rate and regular rhythm.   Pulmonary/Chest: Effort normal and breath sounds normal.  Abdominal: Soft. Bowel sounds are normal.  Lymphadenopathy:    She has no cervical adenopathy.  Neurological: She is alert and  oriented to person, place, and time.  Skin: Skin is warm and dry. Capillary refill takes less than 2 seconds. She is not diaphoretic.  Psychiatric: She has a normal mood and affect.  Nursing note and vitals reviewed.   Urgent Care Course     Procedures (including critical care time)  Labs Review Labs Reviewed  POCT RAPID STREP A - Abnormal; Notable for the following:       Result Value   Streptococcus, Group A Screen (Direct) POSITIVE (*)    All other components within normal limits    Imaging Review No results found.   Visual Acuity Review  Right Eye Distance:   Left Eye Distance:   Bilateral Distance:    Right Eye Near:   Left Eye Near:    Bilateral Near:         MDM   1. Strep pharyngitis    You are positive for strep throat. I have started you on an antibiotic called Augmentin. Take 1 tablet twice a day for ten days. You may take Tylenol or ibuprofen every 4-6 hours as needed for fever and sore throat, or you may use chloraseptic lozenges or throat spray. I would also recommend while you are at the pharmacy to pick up a product called a cerumenlytic. Any brand will do and the pharmacist can help you choose one. Use the product as directed in your left ear. Should your symptoms not improve, follow up with your primary care provider or return to clinic as needed.      Barnet Glasgow, NP 12/04/16 1239

## 2016-12-27 ENCOUNTER — Ambulatory Visit (HOSPITAL_COMMUNITY)
Admission: EM | Admit: 2016-12-27 | Discharge: 2016-12-27 | Disposition: A | Discharge status: Home / Self Care | Payer: BC Managed Care – PPO | Attending: Emergency Medicine | Admitting: Emergency Medicine

## 2016-12-27 ENCOUNTER — Emergency Department (HOSPITAL_COMMUNITY): Payer: BC Managed Care – PPO

## 2016-12-27 ENCOUNTER — Encounter (HOSPITAL_COMMUNITY): Payer: Self-pay

## 2016-12-27 ENCOUNTER — Emergency Department (HOSPITAL_COMMUNITY)
Admission: EM | Admit: 2016-12-27 | Discharge: 2016-12-27 | Disposition: A | Discharge status: Home / Self Care | Payer: BC Managed Care – PPO | Attending: Emergency Medicine | Admitting: Emergency Medicine

## 2016-12-27 ENCOUNTER — Encounter (HOSPITAL_COMMUNITY): Payer: Self-pay | Admitting: Emergency Medicine

## 2016-12-27 ENCOUNTER — Telehealth: Payer: Self-pay | Admitting: Family Medicine

## 2016-12-27 DIAGNOSIS — R079 Chest pain, unspecified: Secondary | ICD-10-CM

## 2016-12-27 DIAGNOSIS — R0789 Other chest pain: Secondary | ICD-10-CM

## 2016-12-27 LAB — I-STAT TROPONIN, ED: TROPONIN I, POC: 0 ng/mL (ref 0.00–0.08)

## 2016-12-27 LAB — CBC
HCT: 40.6 % (ref 36.0–46.0)
Hemoglobin: 13 g/dL (ref 12.0–15.0)
MCH: 27.9 pg (ref 26.0–34.0)
MCHC: 32 g/dL (ref 30.0–36.0)
MCV: 87.1 fL (ref 78.0–100.0)
PLATELETS: 179 10*3/uL (ref 150–400)
RBC: 4.66 MIL/uL (ref 3.87–5.11)
RDW: 13.8 % (ref 11.5–15.5)
WBC: 7.2 10*3/uL (ref 4.0–10.5)

## 2016-12-27 LAB — BASIC METABOLIC PANEL
Anion gap: 8 (ref 5–15)
BUN: 6 mg/dL (ref 6–20)
CALCIUM: 10.1 mg/dL (ref 8.9–10.3)
CO2: 27 mmol/L (ref 22–32)
CREATININE: 0.7 mg/dL (ref 0.44–1.00)
Chloride: 103 mmol/L (ref 101–111)
GFR calc non Af Amer: >60 mL/min (ref >60)
Glucose, Bld: 99 mg/dL (ref 65–99)
Potassium: 3.5 mmol/L (ref 3.5–5.1)
SODIUM: 138 mmol/L (ref 135–145)

## 2016-12-27 MED ORDER — OMEPRAZOLE 20 MG PO CPDR: 20.0000 mg | DELAYED_RELEASE_CAPSULE | Freq: Every day | ORAL | 0 refills | Status: DC

## 2016-12-27 MED ORDER — GI COCKTAIL ~~LOC~~
30.0000 mL | Freq: Once | ORAL | Status: AC
  Administered 2016-12-27: 30 mL via ORAL
  Filled 2016-12-27: qty 30

## 2016-12-27 NOTE — ED Provider Notes (Signed)
[facility] DEPT Provider Note   CSN: [id number] Arrival date & time: 12/27/16  1333     History   Chief Complaint Chief Complaint  Patient presents with  . Chest Pain    HPI GLENDEAN George is a 41 y.o. female.  HPI Patient presents with concern of chest pain. Pain began yesterday, since onset has been persistent, in the sternal area, is burning, dull, with associated dizziness, anorexia, but no vomiting, no fever, no cough, and no dyspnea. Patient denies history of coronary disease, family history of early cardiac death. Patient does have history of heartburn, states this is not the same as her typical heartburn episode. Patient went to urgent care, was sent here for evaluation. No recent medication changes, diet changes, activity changes. 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 No relief with Tums.  Past Medical History:  Diagnosis Date  . 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 History of chicken pox   . History of UTI   . Large for gestational age (LGA) 10/12/2011  . Large for gestational age fetus 10/11/2011  . Normal pregnancy 10/11/2011  . S/P cesarean section 12/31/2013  . S/P cesarean section 08/01/2015    Patient Active Problem List   Diagnosis Date Noted  . Obesity 10/05/2015  . S/P cesarean section 08/01/2015  . AOM (acute otitis media) 12/19/2014  . Pregnant 12/01/2014  . Acute blood loss anemia 05/21/2014  . Knee pain, bilateral 05/21/2014  . Internal hemorrhoids 05/21/2014    Past Surgical History:  Procedure Laterality Date  . BIOPSY BREAST    . BREAST LUMPECTOMY    . CESAREAN SECTION  10/13/2011   Procedure: CESAREAN SECTION;  Surgeon: Thornell Sartorius, MD;  Location: [facility];  Service: Gynecology;  Laterality: N/A;  Primary cesarean section with delivery of baby girl at 73. Apgars 8/9.  Marland Kitchen CESAREAN SECTION N/A 12/31/2013   Procedure: REPEAT CESAREAN SECTION;  Surgeon: Thornell Sartorius, MD;  Location: [facility];   Service: Obstetrics;  Laterality: N/A;  . CESAREAN SECTION N/A 08/01/2015   Procedure: CESAREAN SECTION;  Surgeon: Janyth Contes, MD;  Location: [facility];  Service: Obstetrics;  Laterality: N/A;  . TOOTH EXTRACTION  1995   wisdom teeth taken out  . WISDOM TOOTH EXTRACTION      OB History    Gravida Para Term Preterm AB Living   3 3 3     3    SAB TAB Ectopic Multiple Live Births         0 3       Home Medications    Prior to Admission medications   Medication Sig Start Date End Date Taking? Authorizing Provider  hydrocortisone (ANUSOL-HC) 25 MG suppository Place 1 suppository (25 mg total) rectally at bedtime. 10/05/15   Abner Greenspan, MD  prenatal vitamin w/FE, FA (PRENATAL 1 + 1) 27-1 MG TABS tablet Take 1 tablet by mouth daily. 08/03/15   Janyth Contes, MD    Family History Family History  Problem Relation Age of Onset  . Osteoporosis Mother   . Arthritis/Rheumatoid Mother   . Hypertension Mother   . Diverticulitis Other     3 grandparents   . Arthritis Maternal Grandmother   . Hyperlipidemia Father   . Hypertension Father   . Diabetes Father     pre diabetic  . Sudden death Maternal Uncle     ?heart attack vs Stroke  . Diabetes Paternal Grandmother  Social History Social History  Substance Use Topics  . Smoking status: Never Smoker  . Smokeless tobacco: Never Used  . Alcohol use No     Allergies   Codeine and Morphine and related   Review of Systems Review of Systems  Constitutional:       Per HPI, otherwise negative  HENT:       Per HPI, otherwise negative  Respiratory:       Per HPI, otherwise negative  Cardiovascular:       Per HPI, otherwise negative  Gastrointestinal: Negative for vomiting.  Endocrine:       Negative aside from HPI  Genitourinary:       Neg aside from HPI   Musculoskeletal:       Per HPI, otherwise negative  Skin: Negative.   Allergic/Immunologic: Negative for immunocompromised state.  Neurological:  Negative for syncope.     Physical Exam Updated Vital Signs BP 115/94 (BP Location: Left Arm)   Pulse 74   Temp 98 F (36.7 C) (Oral)   Resp 18   Ht 5\' 6"  (1.676 m)   Wt 190 lb (86.2 kg)   LMP 12/13/2016   SpO2 100%   BMI 30.67 kg/m   Physical Exam  Constitutional: She is oriented to person, place, and time. She appears well-developed and well-nourished. No distress.  HENT:  Head: Normocephalic and atraumatic.  Eyes: Conjunctivae and EOM are normal.  Cardiovascular: Normal rate and regular rhythm.   Pulmonary/Chest: Effort normal and breath sounds normal. No stridor. No respiratory distress.  Abdominal: She exhibits no distension.  Musculoskeletal: She exhibits no edema.  Neurological: She is alert and oriented to person, place, and time. No cranial nerve deficit.  Skin: Skin is warm and dry.  Psychiatric: She has a normal mood and affect.  Nursing note and vitals reviewed.    ED Treatments / Results  Labs (all labs ordered are listed, but only abnormal results are displayed) [place], ED     Radiology Dg Chest 2 View  Result Date: 12/27/2016 CLINICAL DATA:  Chest pain and dizziness for 2 days. EXAM: CHEST  2 VIEW COMPARISON:  None. FINDINGS: The heart size and mediastinal contours are within normal limits. No evidence of pulmonary infiltrate or edema. No evidence of pleural effusion. An asymmetric nodular density is seen within the right upper lung overlying the posterior fifth rib. Pulmonary neoplasm cannot be excluded. IMPRESSION: Right upper lobe nodular density. Pulmonary neoplasm cannot be excluded. Chest CT without contrast recommended for further evaluation . Electronically Signed   By: Earle Gell M.D.   On: 12/27/2016 14:30   Ct Chest Wo Contrast  Result Date: 12/27/2016 CLINICAL DATA:  Chest pain, abnormal chest radiograph EXAM: CT CHEST WITHOUT CONTRAST TECHNIQUE: Multidetector CT imaging of the chest was  performed following the standard protocol without IV contrast. COMPARISON:  Chest radiographs dated 12/27/2016 FINDINGS: Cardiovascular: Heart is normal in size.  No pericardial effusion. No evidence of thoracic aortic aneurysm. Mediastinum/Nodes: Small mediastinal lymph nodes which do not have a CT size criteria. Visualized thyroid is unremarkable. Lungs/Pleura: Lungs are clear. No suspicious pulmonary nodules. No focal consolidation. No pleural effusion or pneumothorax. Upper Abdomen: Visualized upper abdomen is notable for a 1.9 cm probable cyst in the anterior spleen (series 2/image 153). Musculoskeletal: Sclerotic lesion along the right posterolateral 5th rib (coronal image 73), accounting for the radiographic abnormality, likely reflecting a benign bone island. Thoracolumbar spine is within normal  limits. IMPRESSION: No evidence of acute cardiopulmonary disease. Probable benign bone island in the right posterolateral 5th rib, accounting for the radiographic abnormality. Electronically Signed   By: Julian Hy M.D.   On: 12/27/2016 17:05    Procedures Procedures (including critical care time)  Medications Ordered in ED Medications  gi cocktail (Maalox,Lidocaine,Donnatal) (not administered)     Initial Impression / Assessment and Plan / ED Course  I have reviewed the triage vital signs and the nursing notes.  Pertinent labs & imaging results that were available during my care of the patient were reviewed by me and considered in my medical decision making (see chart for details).  Healthy young female presents with ongoing chest pain. Here the patient is awake and alert, hemodynamically stable. Initial x-ray reassuring, but with some consideration of possible malignancy, or other pathology, CT scan was performed. This was reassuring as well. With no ongoing evidence for coronary ischemia, normal troponin, EKG, labs, no evidence for PE, pneumonia, pneumothorax, bacteremia, sepsis,  symptoms likely secondary to gastroesophageal etiology. Patient started on a course of PPI, will follow-up with primary care.   Final Clinical Impressions(s) / ED Diagnoses  Atypical chest pain   Carmin Muskrat, MD 12/27/16 1727

## 2016-12-27 NOTE — ED Triage Notes (Signed)
Per Pt, Pt is coming from UC with complaints of mid-center chest tightness and dizziness that started yesterday while at work. Called PCP and was sent to UC who sen her down her. Denies any N/V, SOB.

## 2016-12-27 NOTE — Discharge Instructions (Signed)
While your EKG and physical exam are normal, I do not have the ability to run blood work to confirm. I am referring you to the ER for further evaluation of your chest pain.

## 2016-12-27 NOTE — Discharge Instructions (Signed)
As discussed, your evaluation today has been largely reassuring.  But, it is important that you monitor your condition carefully, and do not hesitate to return to the ED if you develop new, or concerning changes in your condition. ? ?Otherwise, please follow-up with your physician for appropriate ongoing care. ? ?

## 2016-12-27 NOTE — ED Triage Notes (Signed)
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 NAD... Speaking in complete sentences

## 2016-12-27 NOTE — ED Provider Notes (Signed)
CSN: [id number]     Arrival date & time 12/27/16  1215 History   None    Chief Complaint  Patient presents with  . Chest Pain   (Consider location/radiation/quality/duration/timing/severity/associated sxs/prior Treatment) 41 year old female presents to clinic with 24-hour history of substernal chest pressure, and tightness. Describes her pressure as nonradiating, she also states the pain is occurring at rest, it is not worsened by activity, she has had no shortness of breath, has had some dizziness. She denies any heart palpitations, denies any swelling in her hands feet or ankles, she does not smoke, or drink alcohol, however she does report a history of hypertension. Both of her parents are alive and well, both treated for hypertension, neither have history of stroke, MI, however her father has a diagnosis of "prediabetes". She does have prior history of acid reflux and GERD, however she states her current symptoms are different. She called her family doctor her earlier today and he advised her to go to the ER however she came to urgent care instead.   The history is provided by the patient.    Past Medical History:  Diagnosis Date  . 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 History of chicken pox   . History of UTI   . Large for gestational age (LGA) 10/12/2011  . Large for gestational age fetus 10/11/2011  . Normal pregnancy 10/11/2011  . S/P cesarean section 12/31/2013  . S/P cesarean section 08/01/2015   Past Surgical History:  Procedure Laterality Date  . BIOPSY BREAST    . BREAST LUMPECTOMY    . CESAREAN SECTION  10/13/2011   Procedure: CESAREAN SECTION;  Surgeon: Thornell Sartorius, MD;  Location: [facility];  Service: Gynecology;  Laterality: N/A;  Primary cesarean section with delivery of baby girl at 51. Apgars 8/9.  Marland Kitchen CESAREAN SECTION N/A 12/31/2013   Procedure: REPEAT CESAREAN SECTION;  Surgeon: Thornell Sartorius, MD;  Location: [facility];  Service: Obstetrics;  Laterality: N/A;  . CESAREAN  SECTION N/A 08/01/2015   Procedure: CESAREAN SECTION;  Surgeon: Janyth Contes, MD;  Location: [facility];  Service: Obstetrics;  Laterality: N/A;  . TOOTH EXTRACTION  1995   wisdom teeth taken out  . WISDOM TOOTH EXTRACTION     Family History  Problem Relation Age of Onset  . Osteoporosis Mother   . Arthritis/Rheumatoid Mother   . Hypertension Mother   . Diverticulitis Other     3 grandparents   . Arthritis Maternal Grandmother   . Hyperlipidemia Father   . Hypertension Father   . Diabetes Father     pre diabetic  . Sudden death Maternal Uncle     ?heart attack vs Stroke  . Diabetes Paternal Grandmother    Social History  Substance Use Topics  . Smoking status: Never Smoker  . Smokeless tobacco: Never Used  . Alcohol use No   OB History    Gravida Para Term Preterm AB Living   3 3 3     3    SAB TAB Ectopic Multiple Live Births         0 3     Review of Systems  Reason unable to perform ROS: as covered in HPI.  All other systems reviewed and are negative.   Allergies  Codeine and Morphine and related  Home Medications   Prior to Admission medications   Medication Sig Start Date End Date Taking? Authorizing Provider  hydrocortisone (ANUSOL-HC) 25 MG suppository Place 1 suppository (25 mg total) rectally  at bedtime. 10/05/15   Abner Greenspan, MD  prenatal vitamin w/FE, FA (PRENATAL 1 + 1) 27-1 MG TABS tablet Take 1 tablet by mouth daily. 08/03/15   Janyth Contes, MD   Meds Ordered and Administered this Visit  Medications - No data to display  BP 107/76 (BP Location: Left Arm)   Pulse 73   Temp 97.9 F (36.6 C) (Oral)   Resp 18   LMP 12/13/2016   SpO2 100%   Breastfeeding? No  No data found.   Physical Exam  Constitutional: She is oriented to person, place, and time. She appears well-developed and well-nourished. No distress.  HENT:  Head: Normocephalic and atraumatic.  Neck: Normal range of motion. Neck supple. No JVD present.  Cardiovascular:  Normal rate, regular rhythm and intact distal pulses.  Exam reveals no friction rub.   No murmur heard. Pulmonary/Chest: Effort normal and breath sounds normal. No respiratory distress. She has no wheezes.  Abdominal: Soft. Bowel sounds are normal. She exhibits no distension. There is no tenderness.  Musculoskeletal: She exhibits no edema.  Lymphadenopathy:    She has no cervical adenopathy.  Neurological: She is alert and oriented to person, place, and time.  Skin: Skin is warm and dry. Capillary refill takes less than 2 seconds. She is not diaphoretic. No pallor.  Psychiatric: She has a normal mood and affect.  Nursing note and vitals reviewed.   Urgent Care Course     ED EKG Date/Time: 12/27/2016 1:13 PM Performed by: Barnet Glasgow Authorized by: Barnet Glasgow   ECG reviewed by ED Physician in the absence of a cardiologist: no   Previous ECG:    Previous ECG:  Unavailable Interpretation:    Interpretation: normal   Rate:    ECG rate:  66   ECG rate assessment: normal   Rhythm:    Rhythm: sinus rhythm   Ectopy:    Ectopy: none   QRS:    QRS axis:  Normal Conduction:    Conduction: normal   ST segments:    ST segments:  Normal T waves:    T waves: normal   Comments:     Rate 66 PR 126 ms QRS 86 ms QT/QTc: 406/425 ms    (including critical care time)  Labs Review Labs Reviewed - No data to display  Imaging Review No results found.      MDM   1. Chest pain, unspecified type    While your EKG and physical exam are normal, I do not have the ability to run blood work to confirm. I am referring you to the ER for further evaluation of your chest pain.     Barnet Glasgow, NP 12/27/16 1316

## 2016-12-27 NOTE — Telephone Encounter (Signed)
I spoke with pt who is at [facility] in [facility] and pt said the UC is sending her to [facility] to Dr Glori Bickers.

## 2016-12-27 NOTE — Telephone Encounter (Signed)
Patient Name: Margaret George  DOB: 1976-04-15    Initial Comment Caller states, she is having tightness in her chest, and lightheaded. She has taken Tums. Verified    Nurse Assessment  Nurse: Raphael Gibney, RN, Vanita Ingles Date/Time (Eastern Time): 12/27/2016 11:48:41 AM  Confirm and document reason for call. If symptomatic, describe symptoms. ---Caller states she has had chest tightness that comes and goes which started yesterday. Has been lightheaded twice. No chest pain now. Took some Tums that is not helping. Chest tightness is in the center of her chest.  Does the patient have any new or worsening symptoms? ---Yes  Will a triage be completed? ---Yes  Related visit to physician within the last 2 weeks? ---No  Does the PT have any chronic conditions? (i.e. diabetes, asthma, etc.) ---No  Is the patient pregnant or possibly pregnant? (Ask all females between the ages of 50-55) ---No  Is this a behavioral health or substance abuse call? ---No     Guidelines    Guideline Title Affirmed Question Affirmed Notes  Chest Pain Chest pain lasts > 5 minutes (Exceptions: chest pain occurring > 3 days ago and now asymptomatic; same as previously diagnosed heartburn and has accompanying sour taste in mouth)    Final Disposition User   Go to ED Now (or PCP triage) Raphael Gibney, RN, Vera    Referrals  GO TO FACILITY UNDECIDED   Disagree/Comply: Comply

## 2017-04-28 IMAGING — CT CT CHEST W/O CM
2 of 3 series · 15 of 36 positions shown, 18 images · non-contrast
Comparison: Chest radiographs dated 12/27/2016

CLINICAL DATA: Chest pain, abnormal chest radiograph

EXAM:
CT CHEST WITHOUT CONTRAST
TECHNIQUE: Multidetector CT imaging of the chest was performed following the
standard protocol without IV contrast.

[Series 2: chest w/o 2mm st · axial · non-contrast · 0.71mm/px · z∈[+770,+1042]mm · 12 of 160 slices shown, 15 images]
[im 12/160  mediastinal]
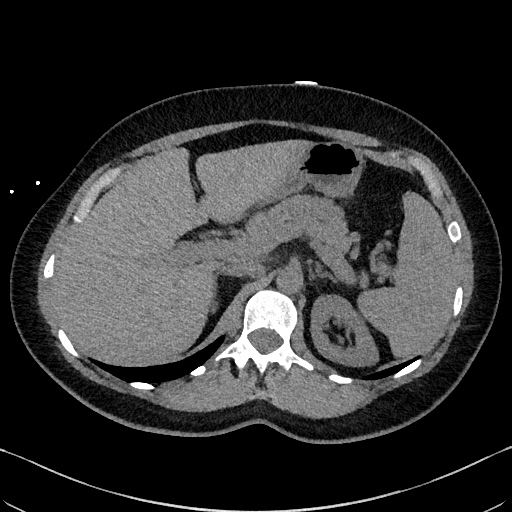
[im 12/160  lung]
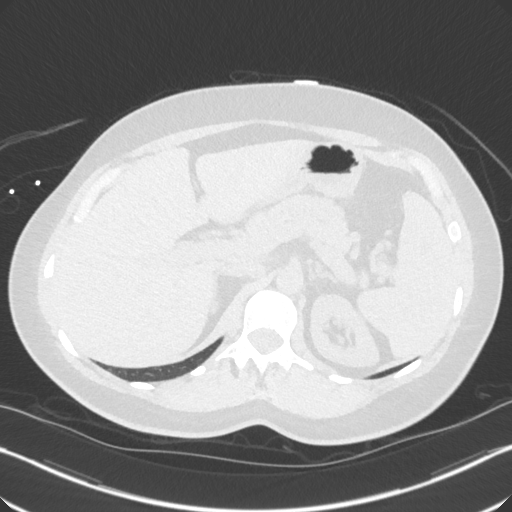
[im 24/160  lung]
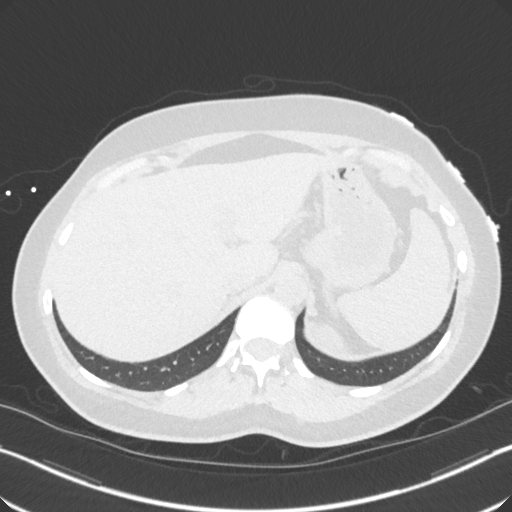
[im 36/160  lung]
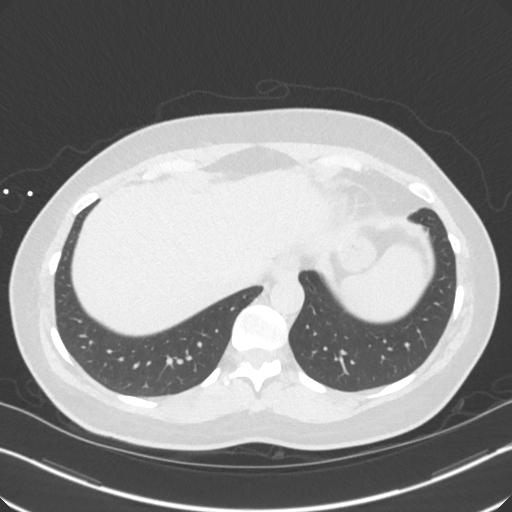
[im 48/160  lung]
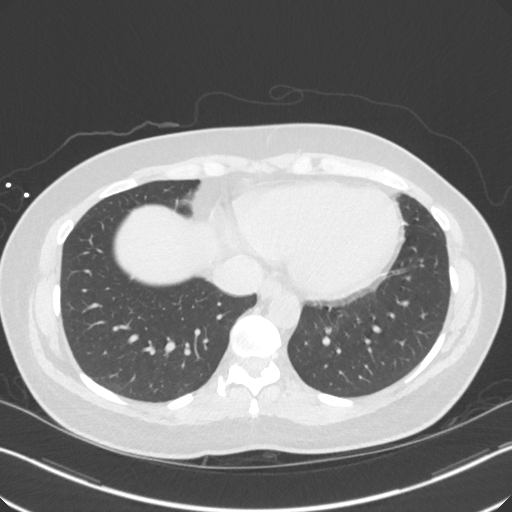
[im 59/160  mediastinal]
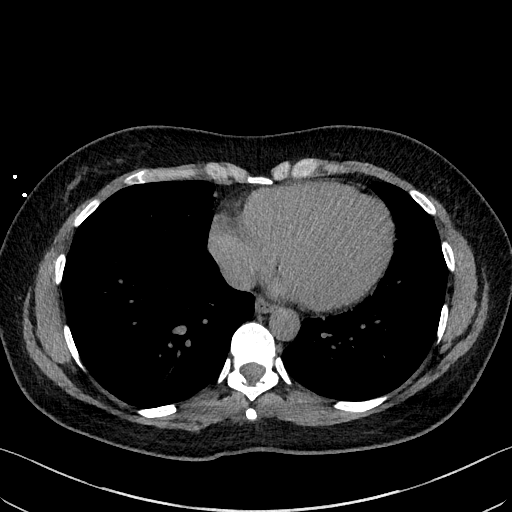
[im 59/160  lung]
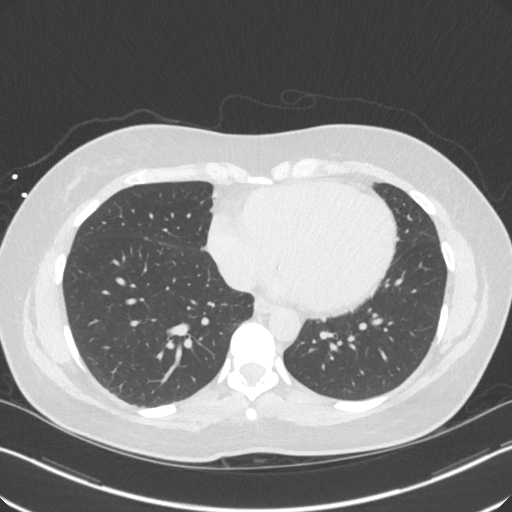
[im 71/160  lung]
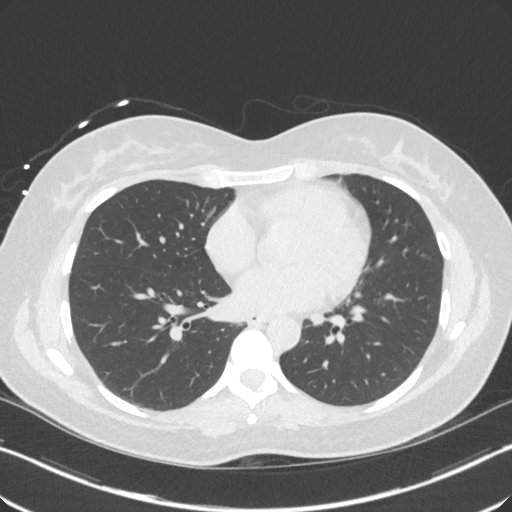
[im 89/160  lung]
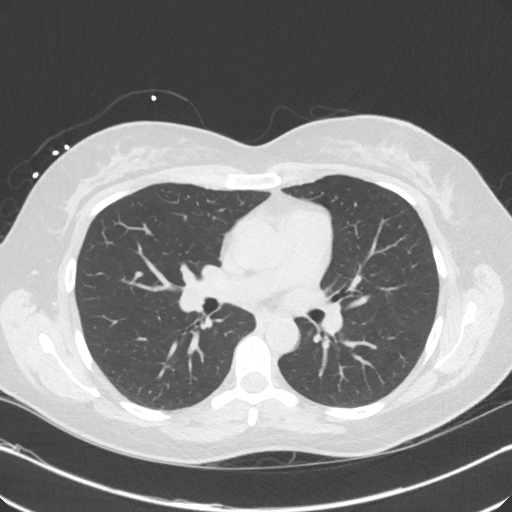
[im 101/160  lung]
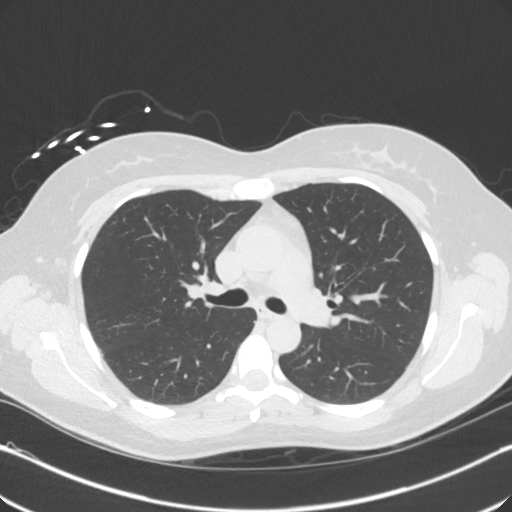
[im 112/160  mediastinal]
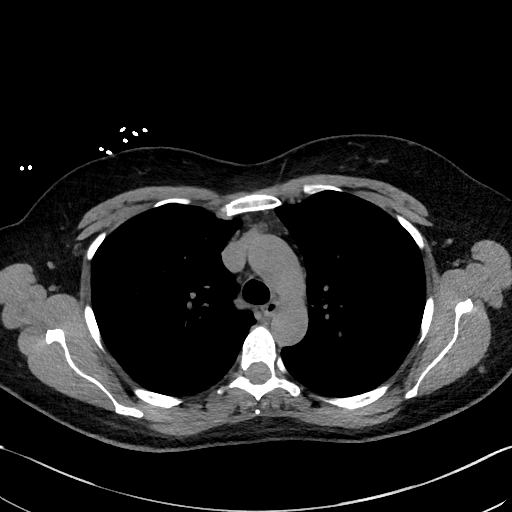
[im 112/160  lung]
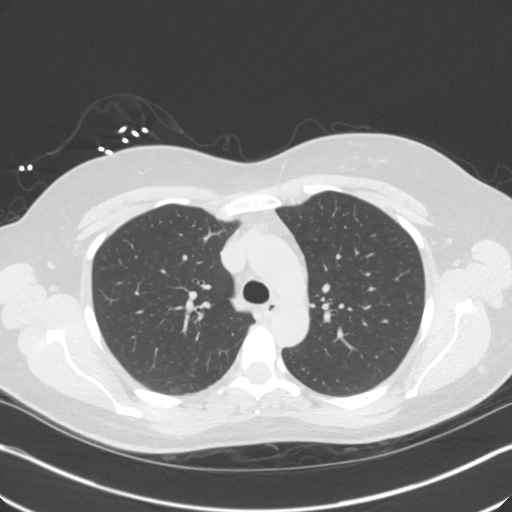
[im 124/160  lung]
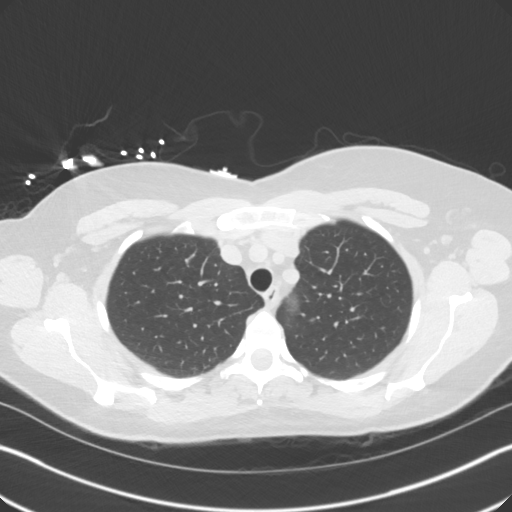
[im 136/160  lung]
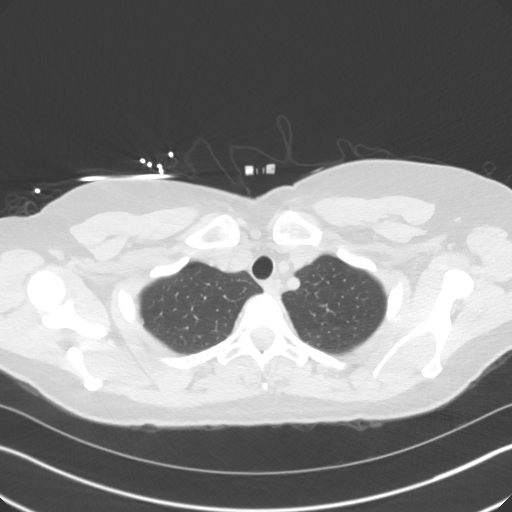
[im 148/160  lung]
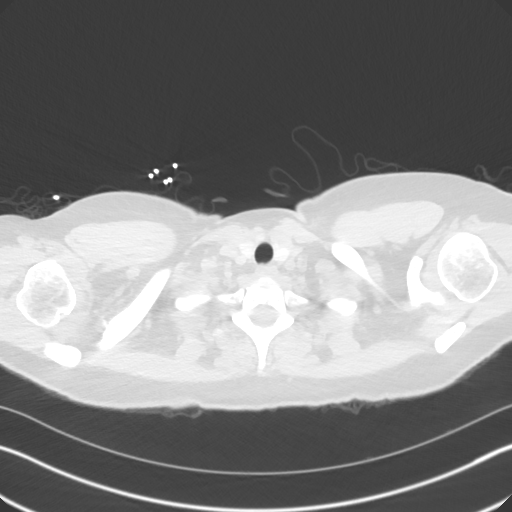

[Series 4: chest w/o 3mm st cor · coronal · non-contrast · 0.62mm/px · 3 of 92 slices shown]
[im 19/92  lung]
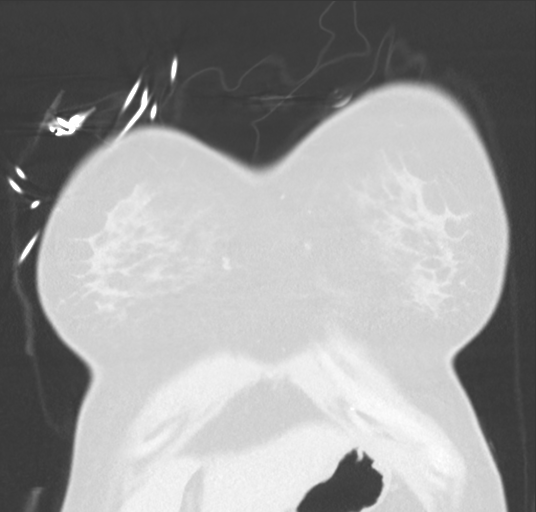
[im 37/92  lung]
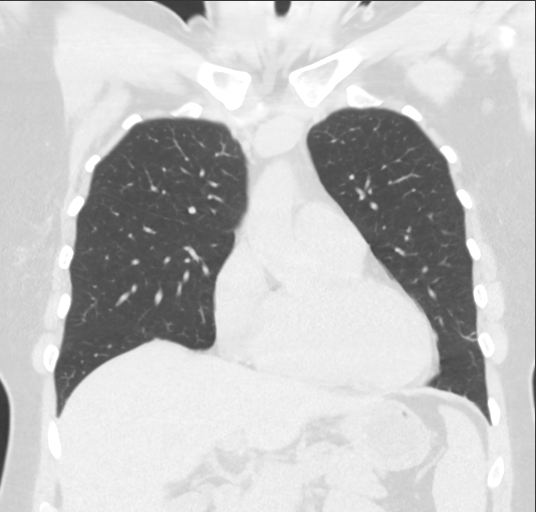
[im 55/92  lung]
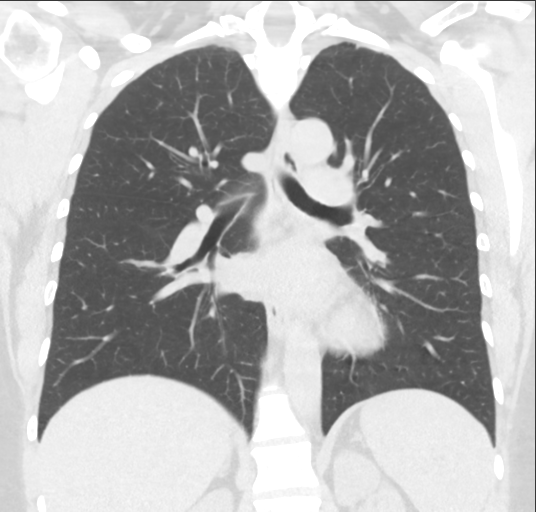

[15 of 36 positions shown; findings below may reference images not displayed]

FINDINGS: Cardiovascular: Heart is normal in size.  No pericardial effusion.

No evidence of thoracic aortic aneurysm.

Mediastinum/Nodes: Small mediastinal lymph nodes which do not have a
CT size criteria.

Visualized thyroid is unremarkable.

Lungs/Pleura: Lungs are clear.

No suspicious pulmonary nodules.

No focal consolidation.

No pleural effusion or pneumothorax.

Upper Abdomen: Visualized upper abdomen is notable for a 1.9 cm
probable cyst in the anterior spleen (series 2/image 153).

Musculoskeletal: Sclerotic lesion along the right posterolateral 5th
rib (coronal image 73), accounting for the radiographic abnormality,
likely reflecting a benign bone island.

Thoracolumbar spine is within normal limits.
IMPRESSION: No evidence of acute cardiopulmonary disease.

Probable benign bone island in the right posterolateral 5th rib,
accounting for the radiographic abnormality.

## 2017-10-21 ENCOUNTER — Encounter: Payer: Self-pay | Admitting: Internal Medicine

## 2017-10-21 ENCOUNTER — Ambulatory Visit: Payer: BC Managed Care – PPO | Admitting: Internal Medicine

## 2017-10-21 VITALS — BP 134/72 | HR 83 | Temp 98.5°F | Wt 195.5 lb

## 2017-10-21 DIAGNOSIS — H6983 Other specified disorders of Eustachian tube, bilateral: Secondary | ICD-10-CM

## 2017-10-21 DIAGNOSIS — H6122 Impacted cerumen, left ear: Secondary | ICD-10-CM

## 2017-10-21 DIAGNOSIS — H9203 Otalgia, bilateral: Secondary | ICD-10-CM | POA: Diagnosis not present

## 2017-10-21 DIAGNOSIS — H65112 Acute and subacute allergic otitis media (mucoid) (sanguinous) (serous), left ear: Secondary | ICD-10-CM

## 2017-10-21 MED ORDER — FLUTICASONE PROPIONATE 50 MCG/ACT NA SUSP: 2.0000 | Freq: Every day | NASAL | 6 refills | Status: DC

## 2017-10-21 MED ORDER — AMOXICILLIN-POT CLAVULANATE 875-125 MG PO TABS: 1.0000 | ORAL_TABLET | Freq: Two times a day (BID) | ORAL | 0 refills | Status: DC

## 2017-10-21 NOTE — Patient Instructions (Signed)
Barotitis Media Barotitis media is inflammation of the middle ear. This condition occurs when an auditory tube (eustachian tube) is blocked in one or both ears. These tubes lead from the middle ear to the back of the nose (nasopharynx). This condition typically occurs when you experience changes in pressure, such as when flying or scuba diving. Untreated barotitis media may lead to damage or hearing loss (barotrauma), which may become permanent. What are the causes? This condition may be caused by changes in air pressure from:  Flying.  Scuba diving.  A nearby explosion. What increases the risk? The following factors may make you more likely to develop this condition:  Middle ear infection.  Sinus infection.  A cold.  Environmental allergies.  Small eustachian tubes.  Recent ear surgery. What are the signs or symptoms? Symptoms of this condition may include:  Ear pain.  Hearing loss. In severe cases, symptoms can include:  Dizziness and nausea (vertigo).  Temporary facial paralysis. How is this diagnosed? This condition is diagnosed based on:  A physical exam. Your health care provider may:  Use a device (otoscope) to look into your ear canal and check your eardrum.  Do a test that changes air pressure in the middle ear to check how well the eardrum moves and to see if the eustachian tube is working(tympanogram).  Your medical history. In some cases, your health care provider may have you take a hearing test. You may also be referred to someone who specializes in ear treatment (otolaryngologist, "ENT"). How is this treated? This condition may be treated with:  Medicines to relieve congestion in your nose, sinus, or upper respiratory tract (decongestants).  Techniques to equalize pressure (to "pop" your ears), such as:  Yawning.  Chewing gum.  Swallowing. In severe cases, you may need surgery to relieve your symptoms or to prevent future inflammation. Follow  these instructions at home:  Take over-the-counter and prescription medicines only as told by your health care provider.  Do not put anything into your ears to clean or unplug them. Ear drops will not help.  Keep all follow-up visits as told by your health care provider. This is important. How is this prevented? Using these strategies may help to prevent barotitis media:  Chewing gum with frequent, forceful swallowing during takeoff and landing when flying.  Holding your nose and gently blowing to pop your ears for equalizing pressure changes. This forces air into the eustachian tube.  Yawning during air pressure changes.  Using a nasal decongestant about 30-60 minutes before flying, if you have nasal congestion. Contact a health care provider if:  You have vertigo.  You have hearing loss.  Your symptoms do not get better or they get worse.  You have a fever. Get help right away if:  You have a severe headache, ear pain, and dizziness.  You have balance problems.  You cannot move or feel part of your face.  You have bloody or pus-like drainage from your ears. Summary  Barotitis media is inflammation of the middle ear.  This condition typically occurs when you experience changes in pressure, such as when flying or scuba diving.  You may be at a higher risk for this condition if you have small eustachian tubes, had recent ear surgery, or have allergies, a cold, or sinus or middle ear infection.  This condition may be treated with medicines or techniques to equalize pressure in your ears.  Strategies can be used to help prevent barotitis media. This information is   not intended to replace advice given to you by your health care provider. Make sure you discuss any questions you have with your health care provider. Document Released: 10/12/2000 Document Revised: 09/03/2016 Document Reviewed: 09/03/2016 Elsevier Interactive Patient Education  2017 Elsevier Inc.  

## 2017-10-21 NOTE — Progress Notes (Addendum)
Subjective:    Patient ID: Margaret George, female    DOB: 1976-10-27, 41 y.o.   MRN: [id number]  HPI  Pt presents to the clinic today with c/o bilateral ear pain, L>R. This started 2 weeks ago. She describes the pain as pressure and popping. She is having some muffled hearing. She denies runny nose, nasal congestion, sore throat or cough. She has not taken anything OTC for her symptoms. She has not had sick contacts.  Review of Systems      Past Medical History:  Diagnosis Date  . 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 History of chicken pox   . History of UTI   . Large for gestational age (LGA) 10/12/2011  . Large for gestational age fetus 10/11/2011  . Normal pregnancy 10/11/2011  . S/P cesarean section 12/31/2013  . S/P cesarean section 08/01/2015    No current outpatient medications on file.   No current facility-administered medications for this visit.     Allergies  Allergen Reactions  . Codeine Itching  . Morphine And Related Itching    Family History  Problem Relation Age of Onset  . Osteoporosis Mother   . Arthritis/Rheumatoid Mother   . Hypertension Mother   . Diverticulitis Other        3 grandparents   . Arthritis Maternal Grandmother   . Hyperlipidemia Father   . Hypertension Father   . Diabetes Father        pre diabetic  . Sudden death Maternal Uncle        ?heart attack vs Stroke  . Diabetes Paternal Grandmother     Social History   Socioeconomic History  . Marital status: Married    Spouse name: Not on file  . Number of children: Not on file  . Years of education: Not on file  . Highest education level: Not on file  Social Needs  . Financial resource strain: Not on file  . Food insecurity - worry: Not on file  . Food insecurity - inability: Not on file  . Transportation needs - medical: Not on file  . Transportation needs - non-medical: Not on file  Occupational History  . Not on file  Tobacco Use  . Smoking status: Never Smoker   . Smokeless tobacco: Never Used  Substance and Sexual Activity  . Alcohol use: No    Alcohol/week: 0.0 oz  . Drug use: No  . Sexual activity: Not on file  Other Topics Concern  . Not on file  Social History Narrative  . Not on file     Constitutional: Denies fever, malaise, fatigue, headache or abrupt weight changes.  HEENT: Pt reports ear pain. Denies eye pain, eye redness, ringing in the ears, wax buildup, runny nose, nasal congestion, bloody nose, or sore throat. Respiratory: Denies difficulty breathing, shortness of breath, cough or sputum production.     No other specific complaints in a complete review of systems (except as listed in HPI above).  Objective:   Physical Exam  BP 134/72   Pulse 83   Temp 98.5 F (36.9 C) (Oral)   Wt 195 lb 8 oz (88.7 kg)   LMP 09/30/2017 (Approximate)   SpO2 96%   BMI 31.55 kg/m  Wt Readings from Last 3 Encounters:  10/21/17 195 lb 8 oz (88.7 kg)  12/27/16 190 lb (86.2 kg)  10/05/15 199 lb 4 oz (90.4 kg)    General: Appears her stated age, in NAD. HEENT:  Right  Ear: Tm's gray and intact, normal light reflex, + serous effusion; Left Ear: cerumen impaction.    BMET    Component Value Date/Time   NA 138 12/27/2016 1345   K 3.5 12/27/2016 1345   CL 103 12/27/2016 1345   CO2 27 12/27/2016 1345   GLUCOSE 99 12/27/2016 1345   BUN 6 12/27/2016 1345   CREATININE 0.70 12/27/2016 1345   CALCIUM 10.1 12/27/2016 1345   GFRNONAA >60 12/27/2016 1345   GFRAA >60 12/27/2016 1345    Lipid Panel  No results found for: CHOL, TRIG, HDL, CHOLHDL, VLDL, LDLCALC  CBC    Component Value Date/Time   WBC 7.2 12/27/2016 1345   RBC 4.66 12/27/2016 1345   HGB 13.0 12/27/2016 1345   HCT 40.6 12/27/2016 1345   PLT 179 12/27/2016 1345   MCV 87.1 12/27/2016 1345   MCH 27.9 12/27/2016 1345   MCHC 32.0 12/27/2016 1345   RDW 13.8 12/27/2016 1345   LYMPHSABS 1.2 07/25/2015 1538   MONOABS 0.7 07/25/2015 1538   EOSABS 0.1 07/25/2015 1538    BASOSABS 0.0 07/25/2015 1538    Hgb A1C No results found for: HGBA1C          Assessment & Plan:   Otalgia, Bilateral ETD, Cerumen Impaction- Left:  Manual lavage by CMA Advised her to try Debrox 2 x week to prevent wax buildup Start Flonase 2 x day for 3 days then daily thereafter  Left Otitis Media:  After cerumen removal, ear drum red, distorted light reflex, opaque drainage noted Will treat with Augmentin 1 tab BID x 10 days  Return precautions discussed Webb Silversmith, NP

## 2017-10-21 NOTE — Addendum Note (Signed)
Addended by: Jearld Fenton on: 10/21/2017 11:36 AM   Modules accepted: Orders

## 2018-04-24 ENCOUNTER — Ambulatory Visit: Payer: BC Managed Care – PPO | Admitting: Podiatry

## 2018-04-24 ENCOUNTER — Encounter: Payer: Self-pay | Admitting: Podiatry

## 2018-04-24 ENCOUNTER — Ambulatory Visit (INDEPENDENT_AMBULATORY_CARE_PROVIDER_SITE_OTHER): Payer: BC Managed Care – PPO

## 2018-04-24 VITALS — BP 99/66 | HR 96 | Resp 16

## 2018-04-24 DIAGNOSIS — M722 Plantar fascial fibromatosis: Secondary | ICD-10-CM | POA: Diagnosis not present

## 2018-04-24 MED ORDER — MELOXICAM 15 MG PO TABS: 15.0000 mg | ORAL_TABLET | Freq: Every day | ORAL | 3 refills | Status: DC

## 2018-04-24 MED ORDER — METHYLPREDNISOLONE 4 MG PO TBPK: ORAL_TABLET | ORAL | 0 refills | Status: DC

## 2018-04-24 NOTE — Patient Instructions (Signed)

## 2018-04-24 NOTE — Progress Notes (Signed)
Subjective:  Patient ID: Margaret George, female    DOB: May 24, 1976,  MRN: [id number] HPI Chief Complaint  Patient presents with  . Foot Pain    Plantar heel left - aching, burning x 2 months, AM pain, notices pain to lateral side too, tried ice and wearing sneakers - no help  . New Patient (Initial Visit)    42 y.o. female presents with the above complaint.   ROS: Denies fever chills nausea vomiting muscle aches pains calf pain back pain chest pain shortness of breath headache.  Past Medical History:  Diagnosis Date  . 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 History of chicken pox   . History of UTI   . Large for gestational age (LGA) 10/12/2011  . Large for gestational age fetus 10/11/2011  . Normal pregnancy 10/11/2011  . S/P cesarean section 12/31/2013  . S/P cesarean section 08/01/2015   Past Surgical History:  Procedure Laterality Date  . BIOPSY BREAST    . BREAST LUMPECTOMY    . CESAREAN SECTION  10/13/2011   Procedure: CESAREAN SECTION;  Surgeon: Thornell Sartorius, MD;  Location: [facility];  Service: Gynecology;  Laterality: N/A;  Primary cesarean section with delivery of baby girl at 38. Apgars 8/9.  Marland Kitchen CESAREAN SECTION N/A 12/31/2013   Procedure: REPEAT CESAREAN SECTION;  Surgeon: Thornell Sartorius, MD;  Location: [facility];  Service: Obstetrics;  Laterality: N/A;  . CESAREAN SECTION N/A 08/01/2015   Procedure: CESAREAN SECTION;  Surgeon: Janyth Contes, MD;  Location: [facility];  Service: Obstetrics;  Laterality: N/A;  . TOOTH EXTRACTION  1995   wisdom teeth taken out  . WISDOM TOOTH EXTRACTION      Current Outpatient Medications:  .  Chlorpheniramine Maleate (ALLERGY PO), Take by mouth., Disp: , Rfl:  .  norethindrone (CAMILA) 0.35 MG tablet, , Disp: , Rfl:  .  fluticasone (FLONASE) 50 MCG/ACT nasal spray, Place 2 sprays into both nostrils daily., Disp: 16 g, Rfl: 6 .  meloxicam (MOBIC) 15 MG tablet, Take 1 tablet (15 mg total) by mouth daily., Disp: 30 tablet, Rfl: 3 .   methylPREDNISolone (MEDROL DOSEPAK) 4 MG TBPK tablet, 6 day dose pack - take as directed, Disp: 21 tablet, Rfl: 0  Allergies  Allergen Reactions  . Codeine Itching  . Morphine And Related Itching   Review of Systems Objective:   Vitals:   04/24/18 1002  BP: 99/66  Pulse: 96  Resp: 16    General: Well developed, nourished, in no acute distress, alert and oriented x3   Dermatological: Skin is warm, dry and supple bilateral. Nails x 10 are well maintained; remaining integument appears unremarkable at this time. There are no open sores, no preulcerative lesions, no rash or signs of infection present.  Vascular: Dorsalis Pedis artery and Posterior Tibial artery pedal pulses are 2/4 bilateral with immedate capillary fill time. Pedal hair growth present. No varicosities and no lower extremity edema present bilateral.   Neruologic: Grossly intact via light touch bilateral. Vibratory intact via tuning fork bilateral. Protective threshold with Semmes Wienstein monofilament intact to all pedal sites bilateral. Patellar and Achilles deep tendon reflexes 2+ bilateral. No Babinski or clonus noted bilateral.   Musculoskeletal: No gross boney pedal deformities bilateral. No pain, crepitus, or limitation noted with foot and ankle range of motion bilateral. Muscular strength 5/5 in all groups tested bilateral.  Pain on palpation medial calcaneal tubercle of the left heel.  No pain on medial lateral compression of the calcaneus.  Gait: Unassisted, Nonantalgic.    Radiographs:  Radiographs taken today demonstrate rectus foot with pes planus soft tissue increase in density plantar fascial calcaneal insertion site.  Assessment & Plan:   Assessment: Plantar fasciitis pes planus.    Plan: Discussed etiology pathology conservative or surgical therapies.  After sterile Betadine skin prep injected 20 mg Kenalog 5 mg Marcaine point maximal tenderness of the left heel.  Placement plantar fascial brace and  a night splint.  Discussed appropriate shoe gear stretching exercise ice therapy sugar modifications start her on a Medrol Dosepak.  Follow-up with her in 1 month     Max T. Crivitz, [place]

## 2018-05-22 ENCOUNTER — Ambulatory Visit: Payer: BC Managed Care – PPO | Admitting: Podiatry

## 2018-05-22 ENCOUNTER — Encounter: Payer: Self-pay | Admitting: Podiatry

## 2018-05-22 DIAGNOSIS — M722 Plantar fascial fibromatosis: Secondary | ICD-10-CM | POA: Diagnosis not present

## 2018-05-22 NOTE — Progress Notes (Signed)
She presents today for follow-up of her plantar fasciitis she states that is a lot better maybe about 70% but is still not quite where it needs to be.  Objective: Vital signs are stable she is alert and oriented x3.  Pulses are palpable.  She has pain on palpation medial calcaneal tubercle of her left heel.  Assessment: Plantar fasciitis left.  Plan: Injected her left heel today after sterile Betadine skin prep 20 mg Kenalog 5 mg Marcaine point maximal tenderness left heel.  Tolerated procedure well without complications.  Follow-up with her in 1 month.

## 2018-06-19 ENCOUNTER — Ambulatory Visit: Payer: BC Managed Care – PPO | Admitting: Podiatry

## 2018-11-18 ENCOUNTER — Ambulatory Visit: Payer: BC Managed Care – PPO | Admitting: Podiatry

## 2018-11-18 DIAGNOSIS — M722 Plantar fascial fibromatosis: Secondary | ICD-10-CM

## 2018-11-19 NOTE — Progress Notes (Signed)
She presents today for follow-up of her plantar fasciitis of her left foot she states that she was doing well since July but this past Saturday really flared up.  She states that it really never went away before but it was manageable.  She feels that she probably needs some type of control in her shoes.  Objective: Vital signs are stable she is alert and oriented x3.  Pulses are palpable.  She has pain on palpation medial calcaneal tubercle of the left heel.  Assessment: Fasciitis recurrence.  Plan: Discussed etiology pathology and surgical therapies at this point were going to send her to our ped orthotist for custom orthotics she also received a cortisone injection 20 mg Kenalog 5 mg Marcaine point maximal tenderness of her left heel.  I will follow-up with her in 1 month.

## 2018-12-02 ENCOUNTER — Encounter: Payer: Self-pay | Admitting: Internal Medicine

## 2018-12-02 ENCOUNTER — Ambulatory Visit: Payer: BC Managed Care – PPO | Admitting: Internal Medicine

## 2018-12-02 VITALS — BP 108/70 | HR 81 | Temp 98.0°F | Wt 180.0 lb

## 2018-12-02 DIAGNOSIS — J029 Acute pharyngitis, unspecified: Secondary | ICD-10-CM | POA: Diagnosis not present

## 2018-12-02 DIAGNOSIS — G44201 Tension-type headache, unspecified, intractable: Secondary | ICD-10-CM | POA: Diagnosis not present

## 2018-12-02 NOTE — Progress Notes (Signed)
HPI  Pt presents to the clinic today with c/o headache and sore throat.  She reports this started this morning.  The headache is located in the forehead.  She describes the pain as pressure.  She denies associated visual changes or dizziness.  She denies difficulty swallowing.  She denies runny nose, nasal congestion, ear pain or cough.  She denies fever, chills or body aches.  She reports her daughter was diagnosed with strep yesterday and is concerned due to exposure.  She has tried ibuprofen with some relief.  Review of Systems      Past Medical History:  Diagnosis Date  . 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 History of chicken pox   . History of UTI   . Large for gestational age (LGA) 10/12/2011  . Large for gestational age fetus 10/11/2011  . Normal pregnancy 10/11/2011  . S/P cesarean section 12/31/2013  . S/P cesarean section 08/01/2015    Family History  Problem Relation Age of Onset  . Osteoporosis Mother   . Arthritis/Rheumatoid Mother   . Hypertension Mother   . Diverticulitis Other        3 grandparents   . Arthritis Maternal Grandmother   . Hyperlipidemia Father   . Hypertension Father   . Diabetes Father        pre diabetic  . Sudden death Maternal Uncle        ?heart attack vs Stroke  . Diabetes Paternal Grandmother     Social History   Socioeconomic History  . Marital status: Married    Spouse name: Not on file  . Number of children: Not on file  . Years of education: Not on file  . Highest education level: Not on file  Occupational History  . Not on file  Social Needs  . Financial resource strain: Not on file  . Food insecurity:    Worry: Not on file    Inability: Not on file  . Transportation needs:    Medical: Not on file    Non-medical: Not on file  Tobacco Use  . Smoking status: Never Smoker  . Smokeless tobacco: Never Used  Substance and Sexual Activity  . Alcohol use: No    Alcohol/week: 0.0 standard drinks  . Drug use: No  . Sexual  activity: Not on file  Lifestyle  . Physical activity:    Days per week: Not on file    Minutes per session: Not on file  . Stress: Not on file  Relationships  . Social connections:    Talks on phone: Not on file    Gets together: Not on file    Attends religious service: Not on file    Active member of club or organization: Not on file    Attends meetings of clubs or organizations: Not on file    Relationship status: Not on file  . Intimate partner violence:    Fear of current or ex partner: Not on file    Emotionally abused: Not on file    Physically abused: Not on file    Forced sexual activity: Not on file  Other Topics Concern  . Not on file  Social History Narrative  . Not on file    Allergies  Allergen Reactions  . Codeine Itching  . Morphine And Related Itching     Constitutional: Positive headache. Denies fatigue, fever or abrupt weight changes.  HEENT:  Positive sore throat. Denies eye redness, eye pain, pressure behind the  eyes, facial pain, nasal congestion, ear pain, ringing in the ears, wax buildup, runny nose or bloody nose. Respiratory:  Denies cough, difficulty breathing or shortness of breath.  Cardiovascular: Denies chest pain, chest tightness, palpitations or swelling in the hands or feet.   No other specific complaints in a complete review of systems (except as listed in HPI above).  Objective:   BP 108/70   Pulse 81   Temp 98 F (36.7 C) (Oral)   Wt 180 lb (81.6 kg)   LMP 11/24/2018   SpO2 99%   BMI 29.05 kg/m  Wt Readings from Last 3 Encounters:  12/02/18 180 lb (81.6 kg)  10/21/17 195 lb 8 oz (88.7 kg)  12/27/16 190 lb (86.2 kg)     General: Appears her stated age, well developed, well nourished in NAD. HEENT: Head: normal shape and size, no sinus tenderness noted; Ears: Tm's gray and intact, normal light reflex; Nose: mucosa pink and moist, septum midline; Throat/Mouth: Teeth present, mucosa erythematous and moist, no exudate noted,  no lesions or ulcerations noted.  Neck: No cervical lymphadenopathy.  Cardiovascular: Normal rate and rhythm. S1,S2 noted.  No murmur, rubs or gallops noted.  Pulmonary/Chest: Normal effort and positive vesicular breath sounds. No respiratory distress. No wheezes, rales or ronchi noted.       Assessment & Plan:  Headache, sore throat:  Rapid strep: Negative Likely viral Get some rest and drink plenty of water Do salt water gargles for the sore throat Start Zyrtec and Flonase OTC  RTC as needed or if symptoms persist.   Webb Silversmith, NP

## 2018-12-02 NOTE — Patient Instructions (Signed)
Sore Throat  When you have a sore throat, your throat may feel:  · Tender.  · Burning.  · Irritated.  · Scratchy.  · Painful when you swallow.  · Painful when you talk.  Many things can cause a sore throat, such as:  · An infection.  · Allergies.  · Dry air.  · Smoke or pollution.  · Radiation treatment.  · Gastroesophageal reflux disease (GERD).  · A tumor.  A sore throat can be the first sign of another sickness. It can happen with other problems, like:  · Coughing.  · Sneezing.  · Fever.  · Swelling in the neck.  Most sore throats go away without treatment.  Follow these instructions at home:         · Take over-the-counter medicines only as told by your doctor.  ? If your child has a sore throat, do not give your child aspirin.  · Drink enough fluids to keep your pee (urine) pale yellow.  · Rest when you feel you need to.  · To help with pain:  ? Sip warm liquids, such as broth, herbal tea, or warm water.  ? Eat or drink cold or frozen liquids, such as frozen ice pops.  ? Gargle with a salt-water mixture 3-4 times a day or as needed. To make a salt-water mixture, add ½-1 tsp (3-6 g) of salt to 1 cup (237 mL) of warm water. Mix it until you cannot see the salt anymore.  ? Suck on hard candy or throat lozenges.  ? Put a cool-mist humidifier in your bedroom at night.  ? Sit in the bathroom with the door closed for 5-10 minutes while you run hot water in the shower.  · Do not use any products that contain nicotine or tobacco, such as cigarettes, e-cigarettes, and chewing tobacco. If you need help quitting, ask your doctor.  · Wash your hands well and often with soap and water. If soap and water are not available, use hand sanitizer.  Contact a doctor if:  · You have a fever for more than 2-3 days.  · You keep having symptoms for more than 2-3 days.  · Your throat does not get better in 7 days.  · You have a fever and your symptoms suddenly get worse.  · Your child who is 3 months to 3 years old has a temperature of  102.2°F (39°C) or higher.  Get help right away if:  · You have trouble breathing.  · You cannot swallow fluids, soft foods, or your saliva.  · You have swelling in your throat or neck that gets worse.  · You keep feeling sick to your stomach (nauseous).  · You keep throwing up (vomiting).  Summary  · A sore throat is pain, burning, irritation, or scratchiness in the throat. Many things can cause a sore throat.  · Take over-the-counter medicines only as told by your doctor. Do not give your child aspirin.  · Drink plenty of fluids, and rest as needed.  · Contact a doctor if your symptoms get worse or your sore throat does not get better within 7 days.  This information is not intended to replace advice given to you by your health care provider. Make sure you discuss any questions you have with your health care provider.  Document Released: 07/24/2008 Document Revised: 03/17/2018 Document Reviewed: 03/17/2018  Elsevier Interactive Patient Education © 2019 Elsevier Inc.

## 2018-12-03 ENCOUNTER — Encounter: Payer: Self-pay | Admitting: Internal Medicine

## 2018-12-09 ENCOUNTER — Ambulatory Visit: Payer: BC Managed Care – PPO | Admitting: Orthotics

## 2018-12-09 DIAGNOSIS — M722 Plantar fascial fibromatosis: Secondary | ICD-10-CM

## 2018-12-09 NOTE — Progress Notes (Signed)
Patient came in today to pick up custom made foot orthotics.  The goals were accomplished and the patient reported no dissatisfaction with said orthotics.  Patient was advised of breakin period and how to report any issues. 

## 2020-06-06 ENCOUNTER — Encounter: Payer: Self-pay | Admitting: Family Medicine

## 2020-06-06 ENCOUNTER — Other Ambulatory Visit: Payer: Self-pay

## 2020-06-06 ENCOUNTER — Ambulatory Visit: Payer: BC Managed Care – PPO | Admitting: Family Medicine

## 2020-06-06 VITALS — BP 98/62 | HR 78 | Temp 97.3°F | Ht 66.5 in | Wt 203.3 lb

## 2020-06-06 DIAGNOSIS — Z021 Encounter for pre-employment examination: Secondary | ICD-10-CM | POA: Diagnosis not present

## 2020-06-06 DIAGNOSIS — Z111 Encounter for screening for respiratory tuberculosis: Secondary | ICD-10-CM

## 2020-06-06 DIAGNOSIS — E6609 Other obesity due to excess calories: Secondary | ICD-10-CM

## 2020-06-06 DIAGNOSIS — Z6832 Body mass index (BMI) 32.0-32.9, adult: Secondary | ICD-10-CM

## 2020-06-06 NOTE — Assessment & Plan Note (Signed)
Discussed how this problem influences overall health and the risks it imposes  Reviewed plan for weight loss with lower calorie diet (via better food choices and also portion control or program like weight watchers) and exercise building up to or more than 30 minutes 5 days per week including some aerobic activity  - once moved (building a house) will have more time for self care

## 2020-06-06 NOTE — Progress Notes (Signed)
Subjective:    Patient ID: Margaret George, female    DOB: [birth year], 44 y.o.   MRN: [id number]  This visit occurred during the SARS-CoV-2 public health emergency.  Safety protocols were in place, including screening questions prior to the visit, additional usage of staff PPE, and extensive cleaning of exam room while observing appropriate contact time as indicated for disinfecting solutions.    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 Encounter for pre-employment examination 06/06/2020  . Obesity 10/05/2015  . S/P cesarean section 08/01/2015  . Pregnant 12/01/2014  . Acute blood loss anemia 05/21/2014  . Knee pain, bilateral 05/21/2014  . Internal hemorrhoids 05/21/2014   Past Medical History:  Diagnosis Date  . 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 History of chicken pox   . History of UTI   . Large for gestational age (LGA) 10/12/2011  . Large for gestational age fetus 10/11/2011  . Normal pregnancy 10/11/2011  . S/P cesarean section 12/31/2013  . S/P cesarean section 08/01/2015   Past Surgical History:  Procedure Laterality Date  . BIOPSY BREAST    . BREAST LUMPECTOMY    . CESAREAN SECTION  10/13/2011   Procedure: CESAREAN  SECTION;  Surgeon: Thornell Sartorius, MD;  Location: [facility];  Service: Gynecology;  Laterality: N/A;  Primary cesarean section with delivery of baby girl at 55. Apgars 8/9.  Marland Kitchen CESAREAN SECTION N/A 12/31/2013   Procedure: REPEAT CESAREAN SECTION;  Surgeon: Thornell Sartorius, MD;  Location: [facility];  Service: Obstetrics;  Laterality: N/A;  . CESAREAN SECTION N/A 08/01/2015   Procedure: CESAREAN SECTION;  Surgeon: Janyth Contes, MD;  Location: [facility];  Service: Obstetrics;  Laterality: N/A;  . TOOTH EXTRACTION  1995   wisdom teeth taken out  . WISDOM TOOTH EXTRACTION     Social History   Tobacco Use  . Smoking status: Never Smoker  . Smokeless tobacco: Never Used  Substance Use Topics  . Alcohol use: No    Alcohol/week: 0.0 standard drinks  . Drug use: No   Family History  Problem Relation Age of Onset  . Osteoporosis Mother   . Arthritis/Rheumatoid Mother   . Hypertension Mother   . Diverticulitis Other        3 grandparents   . Arthritis Maternal Grandmother   . Hyperlipidemia Father   . Hypertension Father   . Diabetes Father        pre diabetic  . Sudden death Maternal Uncle        ?heart attack vs Stroke  . Diabetes Paternal Grandmother  Allergies  Allergen Reactions  . Codeine Itching  . Morphine And Related Itching   Current Outpatient Medications on File Prior to Visit  Medication Sig Dispense Refill  . loratadine (CLARITIN) 10 MG tablet Take 10 mg by mouth daily.     No current facility-administered medications on file prior to visit.     Review of Systems  Constitutional: Negative for activity change, appetite change, fatigue, fever and unexpected weight change.  HENT: Negative for congestion, ear pain, rhinorrhea, sinus pressure and sore throat.   Eyes: Negative for pain, redness and visual disturbance.  Respiratory: Negative for cough, shortness of breath and wheezing.   Cardiovascular: Negative for chest pain and palpitations.  Gastrointestinal: Negative for  abdominal pain, blood in stool, constipation and diarrhea.  Endocrine: Negative for polydipsia and polyuria.  Genitourinary: Negative for dysuria, frequency and urgency.  Musculoskeletal: Negative for arthralgias, back pain and myalgias.  Skin: Negative for pallor and rash.  Allergic/Immunologic: Negative for environmental allergies.  Neurological: Negative for dizziness, syncope and headaches.  Hematological: Negative for adenopathy. Does not bruise/bleed easily.  Psychiatric/Behavioral: Negative for decreased concentration and dysphoric mood. The patient is not nervous/anxious.        Objective:   Physical Exam Constitutional:      General: She is not in acute distress.    Appearance: Normal appearance. She is well-developed. She is obese. She is not ill-appearing.  HENT:     Head: Normocephalic and atraumatic.  Eyes:     General: No scleral icterus.    Conjunctiva/sclera: Conjunctivae normal.     Pupils: Pupils are equal, round, and reactive to light.  Neck:     Thyroid: No thyromegaly.     Vascular: No carotid bruit or JVD.  Cardiovascular:     Rate and Rhythm: Normal rate and regular rhythm.     Heart sounds: Normal heart sounds. No gallop.   Pulmonary:     Effort: Pulmonary effort is normal. No respiratory distress.     Breath sounds: Normal breath sounds. No wheezing or rales.  Abdominal:     General: Bowel sounds are normal. There is no distension or abdominal bruit.     Palpations: Abdomen is soft. There is no mass.     Tenderness: There is no abdominal tenderness.  Musculoskeletal:     Cervical back: Normal range of motion and neck supple.     Right lower leg: No edema.     Left lower leg: No edema.  Lymphadenopathy:     Cervical: No cervical adenopathy.  Skin:    General: Skin is warm and dry.     Findings: No erythema or rash.  Neurological:     Mental Status: She is alert.     Coordination: Coordination normal.     Deep Tendon Reflexes: Reflexes are normal  and symmetric. Reflexes normal.  Psychiatric:        Mood and Affect: Mood normal.        Cognition and Memory: Cognition and memory normal.           Assessment & Plan:   Problem List Items Addressed This Visit      Other   Obesity    Discussed how this problem influences overall health and the risks it imposes  Reviewed plan for weight loss with lower calorie diet (via better food choices and also portion control or program like weight watchers) and exercise building up to or more than 30 minutes 5 days per week including some aerobic  activity  - once moved (building a house) will have more time for self care       Encounter for pre-employment examination - Primary    No restrictions for work utd imms Will get flu shot in the fall Is covid immunized  PPD done today-will read and then return her form  Enc good health habits       Other Visit Diagnoses    Screening-pulmonary TB       Relevant Orders   TB Skin Test

## 2020-06-06 NOTE — Patient Instructions (Addendum)
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

## 2020-06-06 NOTE — Assessment & Plan Note (Signed)
No restrictions for work utd imms Will get flu shot in the fall Is covid immunized  PPD done today-will read and then return her form  Enc good health habits

## 2020-06-08 LAB — TB SKIN TEST
Induration: 0 mm
TB Skin Test: NEGATIVE
